# Patient Record
Sex: Female | Born: 1970
Health system: Southern US, Community
[De-identification: ages and names within clinical notes are randomized; demographics above are authoritative.]

## PROBLEM LIST (undated history)

## (undated) DIAGNOSIS — B009 Herpesviral infection, unspecified: Secondary | ICD-10-CM

## (undated) DIAGNOSIS — I2699 Other pulmonary embolism without acute cor pulmonale: Secondary | ICD-10-CM

## (undated) DIAGNOSIS — U071 COVID-19: Secondary | ICD-10-CM

## (undated) DIAGNOSIS — L659 Nonscarring hair loss, unspecified: Secondary | ICD-10-CM

## (undated) DIAGNOSIS — K219 Gastro-esophageal reflux disease without esophagitis: Secondary | ICD-10-CM

## (undated) HISTORY — DX: Gastro-esophageal reflux disease without esophagitis: K21.9

## (undated) HISTORY — DX: Herpesviral infection, unspecified: B00.9

## (undated) HISTORY — DX: Other pulmonary embolism without acute cor pulmonale: I26.99

## (undated) HISTORY — DX: Nonscarring hair loss, unspecified: L65.9

---

## 2008-05-11 ENCOUNTER — Emergency Department (HOSPITAL_COMMUNITY): Admission: EM | Admit: 2008-05-11 | Discharge: 2008-05-11 | Payer: Self-pay | Admitting: Emergency Medicine

## 2009-03-12 ENCOUNTER — Inpatient Hospital Stay (HOSPITAL_COMMUNITY): Admission: AD | Admit: 2009-03-12 | Discharge: 2009-03-13 | Payer: Self-pay | Admitting: Obstetrics and Gynecology

## 2010-07-27 LAB — CBC
HCT: 38 % (ref 36.0–46.0)
Hemoglobin: 10.7 g/dL — ABNORMAL LOW (ref 12.0–15.0)
Hemoglobin: 12.8 g/dL (ref 12.0–15.0)
MCHC: 33.7 g/dL (ref 30.0–36.0)
MCV: 92.4 fL (ref 78.0–100.0)
RBC: 3.45 MIL/uL — ABNORMAL LOW (ref 3.87–5.11)
RDW: 14.3 % (ref 11.5–15.5)
WBC: 18 10*3/uL — ABNORMAL HIGH (ref 4.0–10.5)

## 2013-05-28 ENCOUNTER — Other Ambulatory Visit: Payer: Self-pay | Admitting: Obstetrics and Gynecology

## 2013-05-28 DIAGNOSIS — R928 Other abnormal and inconclusive findings on diagnostic imaging of breast: Secondary | ICD-10-CM

## 2013-06-12 ENCOUNTER — Ambulatory Visit
Admission: RE | Admit: 2013-06-12 | Discharge: 2013-06-12 | Disposition: A | Discharge status: Home / Self Care | Payer: 59 | Source: Ambulatory Visit | Attending: Obstetrics and Gynecology | Admitting: Obstetrics and Gynecology

## 2013-06-12 ENCOUNTER — Other Ambulatory Visit: Payer: Self-pay | Admitting: Obstetrics and Gynecology

## 2013-06-12 DIAGNOSIS — R928 Other abnormal and inconclusive findings on diagnostic imaging of breast: Secondary | ICD-10-CM

## 2013-06-12 DIAGNOSIS — N6320 Unspecified lump in the left breast, unspecified quadrant: Secondary | ICD-10-CM

## 2014-06-15 ENCOUNTER — Other Ambulatory Visit: Payer: Self-pay | Admitting: Obstetrics and Gynecology

## 2014-06-15 DIAGNOSIS — N63 Unspecified lump in unspecified breast: Secondary | ICD-10-CM

## 2014-06-22 ENCOUNTER — Ambulatory Visit
Admission: RE | Admit: 2014-06-22 | Discharge: 2014-06-22 | Disposition: A | Discharge status: Home / Self Care | Payer: 59 | Source: Ambulatory Visit | Attending: Obstetrics and Gynecology | Admitting: Obstetrics and Gynecology

## 2014-06-22 DIAGNOSIS — N63 Unspecified lump in unspecified breast: Secondary | ICD-10-CM

## 2014-08-14 ENCOUNTER — Other Ambulatory Visit: Payer: Self-pay | Admitting: Family Medicine

## 2014-08-14 MED ORDER — CEPHALEXIN 500 MG PO CAPS: 500.0000 mg | ORAL_CAPSULE | Freq: Three times a day (TID) | ORAL | Status: DC

## 2015-06-16 DIAGNOSIS — Z124 Encounter for screening for malignant neoplasm of cervix: Secondary | ICD-10-CM | POA: Diagnosis not present

## 2015-06-16 DIAGNOSIS — J329 Chronic sinusitis, unspecified: Secondary | ICD-10-CM | POA: Diagnosis not present

## 2015-06-16 DIAGNOSIS — Z6827 Body mass index (BMI) 27.0-27.9, adult: Secondary | ICD-10-CM | POA: Diagnosis not present

## 2015-06-16 DIAGNOSIS — Z1389 Encounter for screening for other disorder: Secondary | ICD-10-CM | POA: Diagnosis not present

## 2015-06-16 DIAGNOSIS — Z1151 Encounter for screening for human papillomavirus (HPV): Secondary | ICD-10-CM | POA: Diagnosis not present

## 2015-06-16 DIAGNOSIS — Z01419 Encounter for gynecological examination (general) (routine) without abnormal findings: Secondary | ICD-10-CM | POA: Diagnosis not present

## 2015-06-16 DIAGNOSIS — Z130 Encounter for screening for diseases of the blood and blood-forming organs and certain disorders involving the immune mechanism: Secondary | ICD-10-CM | POA: Diagnosis not present

## 2015-06-16 MED FILL — AZITHROMYCIN 250 MG TABLET: 250 | 5 days supply | Qty: 6 | Fill #0

## 2015-06-23 DIAGNOSIS — Z1231 Encounter for screening mammogram for malignant neoplasm of breast: Secondary | ICD-10-CM | POA: Diagnosis not present

## 2016-02-11 DIAGNOSIS — Z23 Encounter for immunization: Secondary | ICD-10-CM | POA: Diagnosis not present

## 2016-04-05 MED FILL — BIMATOPROST 0.03% EYELASH S: 0.03 | 30 days supply | Qty: 5 | Fill #0

## 2016-07-07 DIAGNOSIS — L638 Other alopecia areata: Secondary | ICD-10-CM | POA: Diagnosis not present

## 2016-07-07 MED FILL — CLOBETASOL PROP 0.05% FOAM: 0.05 | 30 days supply | Qty: 100 | Fill #0

## 2016-08-18 DIAGNOSIS — Z79899 Other long term (current) drug therapy: Secondary | ICD-10-CM | POA: Diagnosis not present

## 2016-08-18 DIAGNOSIS — L638 Other alopecia areata: Secondary | ICD-10-CM | POA: Diagnosis not present

## 2016-08-18 MED FILL — FOLIC ACID 1 MG TABLET: 1 | 30 days supply | Qty: 30 | Fill #0

## 2016-09-22 DIAGNOSIS — L638 Other alopecia areata: Secondary | ICD-10-CM | POA: Diagnosis not present

## 2016-09-25 MED FILL — FOLIC ACID 1 MG TABLET: 1 | 30 days supply | Qty: 30 | Fill #1

## 2018-02-08 MED FILL — CHLORHEXIDINE 0.12% RINSE: 0.12 | 16 days supply | Qty: 473 | Fill #0

## 2018-10-25 MED FILL — CLOBETASOL PROPIONATE 0.05: 0.05 | 10 days supply | Qty: 45 | Fill #0

## 2019-02-18 ENCOUNTER — Other Ambulatory Visit: Payer: Self-pay | Admitting: Advanced Practice Midwife

## 2019-02-18 DIAGNOSIS — N3000 Acute cystitis without hematuria: Secondary | ICD-10-CM

## 2019-02-18 MED ORDER — NITROFURANTOIN MONOHYD MACRO 100 MG PO CAPS: 100.0000 mg | ORAL_CAPSULE | Freq: Two times a day (BID) | ORAL | 1 refills | Status: DC

## 2019-02-18 MED FILL — NITROFURANTOIN MONO-MCR 100: 100 | 7 days supply | Qty: 14 | Fill #0

## 2019-02-18 NOTE — Progress Notes (Signed)
+   UTI, treated based on reported symptoms  Mallie Snooks, MSN, CNM Certified Nurse Midwife, [employer] for [employer], [place] 02/18/19 12:09 PM

## 2019-06-02 ENCOUNTER — Other Ambulatory Visit: Payer: Self-pay | Admitting: Obstetrics and Gynecology

## 2019-06-02 DIAGNOSIS — Z1231 Encounter for screening mammogram for malignant neoplasm of breast: Secondary | ICD-10-CM

## 2019-07-04 ENCOUNTER — Other Ambulatory Visit: Payer: Self-pay

## 2019-07-04 ENCOUNTER — Ambulatory Visit
Admission: RE | Admit: 2019-07-04 | Discharge: 2019-07-04 | Disposition: A | Discharge status: Home / Self Care | Payer: 59 | Source: Ambulatory Visit | Attending: Obstetrics and Gynecology | Admitting: Obstetrics and Gynecology

## 2019-07-04 DIAGNOSIS — Z1231 Encounter for screening mammogram for malignant neoplasm of breast: Secondary | ICD-10-CM

## 2019-08-11 MED FILL — NITROFURANTOIN MONO-MCR 100: 100 | 7 days supply | Qty: 14 | Fill #0

## 2019-12-09 ENCOUNTER — Other Ambulatory Visit (HOSPITAL_BASED_OUTPATIENT_CLINIC_OR_DEPARTMENT_OTHER): Payer: Self-pay | Admitting: Obstetrics and Gynecology

## 2019-12-09 MED FILL — PANTOPRAZOLE SOD DR 40 MG T: 40 | 90 days supply | Qty: 90 | Fill #0

## 2020-01-13 MED FILL — FLUARIX QUADRIVALENT 0.5 ML: 0.5 | 1 days supply | Qty: 1 | Fill #0

## 2020-02-23 MED FILL — PANTOPRAZOLE SOD DR 40 MG T: 40 | 90 days supply | Qty: 90 | Fill #1

## 2020-06-01 ENCOUNTER — Other Ambulatory Visit: Payer: Self-pay | Admitting: Obstetrics and Gynecology

## 2020-06-01 DIAGNOSIS — Z1231 Encounter for screening mammogram for malignant neoplasm of breast: Secondary | ICD-10-CM

## 2020-06-28 ENCOUNTER — Other Ambulatory Visit (HOSPITAL_BASED_OUTPATIENT_CLINIC_OR_DEPARTMENT_OTHER): Payer: Self-pay | Admitting: Dermatology

## 2020-06-28 DIAGNOSIS — L638 Other alopecia areata: Secondary | ICD-10-CM | POA: Diagnosis not present

## 2020-06-28 MED FILL — CLOBETASOL PROP 0.05% FOAM: 0.05 | 30 days supply | Qty: 50 | Fill #0

## 2020-06-28 MED FILL — BIMATOPROST 0.03% EYELASH S: 0.03 | 30 days supply | Qty: 5 | Fill #0

## 2020-07-21 ENCOUNTER — Ambulatory Visit
Admission: RE | Admit: 2020-07-21 | Discharge: 2020-07-21 | Disposition: A | Discharge status: Home / Self Care | Payer: 59 | Source: Ambulatory Visit | Attending: Obstetrics and Gynecology | Admitting: Obstetrics and Gynecology

## 2020-07-21 ENCOUNTER — Other Ambulatory Visit: Payer: Self-pay

## 2020-07-21 DIAGNOSIS — Z1231 Encounter for screening mammogram for malignant neoplasm of breast: Secondary | ICD-10-CM | POA: Diagnosis not present

## 2020-07-21 DIAGNOSIS — L659 Nonscarring hair loss, unspecified: Secondary | ICD-10-CM | POA: Insufficient documentation

## 2020-10-15 ENCOUNTER — Other Ambulatory Visit (HOSPITAL_BASED_OUTPATIENT_CLINIC_OR_DEPARTMENT_OTHER): Payer: Self-pay

## 2020-10-15 MED FILL — Pantoprazole Sodium EC Tab 40 MG (Base Equiv): ORAL | 90 days supply | Qty: 90 | Fill #0 | Status: AC

## 2020-12-13 ENCOUNTER — Other Ambulatory Visit (HOSPITAL_BASED_OUTPATIENT_CLINIC_OR_DEPARTMENT_OTHER): Payer: Self-pay

## 2020-12-13 MED ORDER — LO LOESTRIN FE 1 MG-10 MCG / 10 MCG PO TABS
1.0000 | ORAL_TABLET | Freq: Every day | ORAL | 1 refills | Status: DC
  Filled 2020-12-13: qty 84, 84d supply, fill #0

## 2021-02-08 ENCOUNTER — Ambulatory Visit: Payer: 59 | Attending: Internal Medicine

## 2021-02-08 ENCOUNTER — Other Ambulatory Visit (HOSPITAL_BASED_OUTPATIENT_CLINIC_OR_DEPARTMENT_OTHER): Payer: Self-pay

## 2021-02-08 DIAGNOSIS — Z23 Encounter for immunization: Secondary | ICD-10-CM

## 2021-02-08 MED ORDER — INFLUENZA VAC SPLIT QUAD 0.5 ML IM SUSY
PREFILLED_SYRINGE | INTRAMUSCULAR | 0 refills | Status: DC
  Filled 2021-02-08: qty 0.5, 1d supply, fill #0

## 2021-02-08 NOTE — Progress Notes (Signed)
   Covid-19 Vaccination Clinic  Name:  April Herring    MRN: [id number] DOB: 1970/12/09  02/08/2021  Ms. April Herring was observed post Covid-19 immunization for 15 minutes without incident. She was provided with Vaccine Information Sheet and instruction to access the V-Safe system.   Ms. April Herring was instructed to call 911 with any severe reactions post vaccine: Difficulty breathing  Swelling of face and throat  A fast heartbeat  A bad rash all over body  Dizziness and weakness

## 2021-03-02 ENCOUNTER — Inpatient Hospital Stay (HOSPITAL_BASED_OUTPATIENT_CLINIC_OR_DEPARTMENT_OTHER)
Admission: EM | Admit: 2021-03-02 | Discharge: 2021-03-04 | DRG: 175 | Disposition: A | Discharge status: Home / Self Care | Payer: 59 | Attending: Family Medicine | Admitting: Family Medicine

## 2021-03-02 ENCOUNTER — Emergency Department (HOSPITAL_BASED_OUTPATIENT_CLINIC_OR_DEPARTMENT_OTHER): Payer: 59

## 2021-03-02 ENCOUNTER — Encounter (HOSPITAL_BASED_OUTPATIENT_CLINIC_OR_DEPARTMENT_OTHER): Payer: Self-pay | Admitting: Emergency Medicine

## 2021-03-02 DIAGNOSIS — N951 Menopausal and female climacteric states: Secondary | ICD-10-CM | POA: Diagnosis present

## 2021-03-02 DIAGNOSIS — I82452 Acute embolism and thrombosis of left peroneal vein: Secondary | ICD-10-CM | POA: Diagnosis not present

## 2021-03-02 DIAGNOSIS — N939 Abnormal uterine and vaginal bleeding, unspecified: Secondary | ICD-10-CM | POA: Diagnosis not present

## 2021-03-02 DIAGNOSIS — Z86711 Personal history of pulmonary embolism: Secondary | ICD-10-CM

## 2021-03-02 DIAGNOSIS — I82431 Acute embolism and thrombosis of right popliteal vein: Secondary | ICD-10-CM | POA: Diagnosis not present

## 2021-03-02 DIAGNOSIS — R778 Other specified abnormalities of plasma proteins: Secondary | ICD-10-CM | POA: Diagnosis present

## 2021-03-02 DIAGNOSIS — I2694 Multiple subsegmental pulmonary emboli without acute cor pulmonale: Secondary | ICD-10-CM

## 2021-03-02 DIAGNOSIS — R000 Tachycardia, unspecified: Secondary | ICD-10-CM | POA: Diagnosis not present

## 2021-03-02 DIAGNOSIS — J90 Pleural effusion, not elsewhere classified: Secondary | ICD-10-CM | POA: Diagnosis not present

## 2021-03-02 DIAGNOSIS — Z8616 Personal history of COVID-19: Secondary | ICD-10-CM | POA: Diagnosis not present

## 2021-03-02 DIAGNOSIS — R509 Fever, unspecified: Secondary | ICD-10-CM | POA: Diagnosis not present

## 2021-03-02 DIAGNOSIS — R0602 Shortness of breath: Secondary | ICD-10-CM | POA: Diagnosis not present

## 2021-03-02 DIAGNOSIS — I2699 Other pulmonary embolism without acute cor pulmonale: Secondary | ICD-10-CM | POA: Diagnosis not present

## 2021-03-02 DIAGNOSIS — E876 Hypokalemia: Secondary | ICD-10-CM | POA: Diagnosis present

## 2021-03-02 DIAGNOSIS — Z20822 Contact with and (suspected) exposure to covid-19: Secondary | ICD-10-CM | POA: Diagnosis present

## 2021-03-02 DIAGNOSIS — Z79899 Other long term (current) drug therapy: Secondary | ICD-10-CM

## 2021-03-02 DIAGNOSIS — I2609 Other pulmonary embolism with acute cor pulmonale: Principal | ICD-10-CM | POA: Diagnosis present

## 2021-03-02 DIAGNOSIS — I2721 Secondary pulmonary arterial hypertension: Secondary | ICD-10-CM | POA: Diagnosis present

## 2021-03-02 DIAGNOSIS — K219 Gastro-esophageal reflux disease without esophagitis: Secondary | ICD-10-CM | POA: Diagnosis present

## 2021-03-02 DIAGNOSIS — R059 Cough, unspecified: Secondary | ICD-10-CM | POA: Diagnosis not present

## 2021-03-02 DIAGNOSIS — R7989 Other specified abnormal findings of blood chemistry: Secondary | ICD-10-CM | POA: Diagnosis not present

## 2021-03-02 HISTORY — DX: COVID-19: U07.1

## 2021-03-02 LAB — CBC
HCT: 39.9 % (ref 36.0–46.0)
Hemoglobin: 13.2 g/dL (ref 12.0–15.0)
MCH: 30.9 pg (ref 26.0–34.0)
MCHC: 33.1 g/dL (ref 30.0–36.0)
MCV: 93.4 fL (ref 80.0–100.0)
Platelets: 242 10*3/uL (ref 150–400)
RBC: 4.27 MIL/uL (ref 3.87–5.11)
RDW: 12.1 % (ref 11.5–15.5)
WBC: 7.3 10*3/uL (ref 4.0–10.5)
nRBC: 0 % (ref 0.0–0.2)

## 2021-03-02 LAB — BASIC METABOLIC PANEL
Anion gap: 9 (ref 5–15)
BUN: 10 mg/dL (ref 6–20)
CO2: 22 mmol/L (ref 22–32)
Calcium: 8.5 mg/dL — ABNORMAL LOW (ref 8.9–10.3)
Chloride: 105 mmol/L (ref 98–111)
Creatinine, Ser: 0.72 mg/dL (ref 0.44–1.00)
GFR, Estimated: >60 mL/min (ref >60)
Glucose, Bld: 138 mg/dL — ABNORMAL HIGH (ref 70–99)
Potassium: 3.2 mmol/L — ABNORMAL LOW (ref 3.5–5.1)
Sodium: 136 mmol/L (ref 135–145)

## 2021-03-02 LAB — APTT: aPTT: 27 seconds (ref 24–36)

## 2021-03-02 LAB — D-DIMER, QUANTITATIVE: D-Dimer, Quant: 9.57 ug/mL-FEU — ABNORMAL HIGH (ref 0.00–0.50)

## 2021-03-02 LAB — RESP PANEL BY RT-PCR (FLU A&B, COVID) ARPGX2
Influenza A by PCR: NEGATIVE
Influenza B by PCR: NEGATIVE
SARS Coronavirus 2 by RT PCR: NEGATIVE

## 2021-03-02 LAB — HEPARIN LEVEL (UNFRACTIONATED)
Heparin Unfractionated: 0.95 IU/mL — ABNORMAL HIGH (ref 0.30–0.70)
Heparin Unfractionated: 0.15 IU/mL — ABNORMAL LOW (ref 0.30–0.70)

## 2021-03-02 LAB — BRAIN NATRIURETIC PEPTIDE: B Natriuretic Peptide: 41.4 pg/mL (ref 0.0–100.0)

## 2021-03-02 LAB — TROPONIN I (HIGH SENSITIVITY)
Troponin I (High Sensitivity): 32 ng/L — ABNORMAL HIGH (ref <18)
Troponin I (High Sensitivity): 29 ng/L — ABNORMAL HIGH (ref <18)

## 2021-03-02 LAB — PROTIME-INR
INR: 1 (ref 0.8–1.2)
Prothrombin Time: 13.4 seconds (ref 11.4–15.2)

## 2021-03-02 MED ORDER — ACETAMINOPHEN 325 MG PO TABS
650.0000 mg | ORAL_TABLET | Freq: Once | ORAL | Status: AC
  Administered 2021-03-02: 650 mg via ORAL
  Filled 2021-03-02: qty 2

## 2021-03-02 MED ORDER — PANTOPRAZOLE SODIUM 40 MG PO TBEC
40.0000 mg | DELAYED_RELEASE_TABLET | Freq: Two times a day (BID) | ORAL | Status: DC
  Administered 2021-03-02 – 2021-03-04 (×4): 40 mg via ORAL
  Filled 2021-03-02 (×4): qty 1

## 2021-03-02 MED ORDER — HEPARIN BOLUS VIA INFUSION
2400.0000 [IU] | Freq: Once | INTRAVENOUS | Status: AC
  Administered 2021-03-02: 2400 [IU] via INTRAVENOUS
  Filled 2021-03-02: qty 2400

## 2021-03-02 MED ORDER — IBUPROFEN 200 MG PO TABS
400.0000 mg | ORAL_TABLET | Freq: Three times a day (TID) | ORAL | Status: DC
  Administered 2021-03-02: 400 mg via ORAL
  Filled 2021-03-02 (×3): qty 2

## 2021-03-02 MED ORDER — IOHEXOL 350 MG/ML SOLN
100.0000 mL | Freq: Once | INTRAVENOUS | Status: AC | PRN
  Administered 2021-03-02: 100 mL via INTRAVENOUS

## 2021-03-02 MED ORDER — SODIUM CHLORIDE 0.45 % IV SOLN: INTRAVENOUS | Status: DC

## 2021-03-02 MED ORDER — HEPARIN (PORCINE) 25000 UT/250ML-% IV SOLN
1900.0000 [IU]/h | INTRAVENOUS | Status: DC
  Administered 2021-03-02: 1350 [IU]/h via INTRAVENOUS
  Administered 2021-03-03: 1900 [IU]/h via INTRAVENOUS
  Administered 2021-03-03: 1600 [IU]/h via INTRAVENOUS
  Administered 2021-03-04: 1900 [IU]/h via INTRAVENOUS
  Filled 2021-03-02 (×4): qty 250

## 2021-03-02 MED ORDER — HEPARIN BOLUS VIA INFUSION
6300.0000 [IU] | Freq: Once | INTRAVENOUS | Status: AC
  Administered 2021-03-02: 6300 [IU] via INTRAVENOUS

## 2021-03-02 NOTE — H&P (Signed)
History and Physical    April Herring OAC:[id number] DOB: [birth year] DOA: 03/02/2021  PCP: Huel Cote, MD   Patient coming from: home  I have personally briefly reviewed patient's old medical records in [facility] Link  Chief Complaint: left lower chest pain and SOB  HPI: April Herring is a 50 y.o. female with medical history significant of Covid 4 infectin in January '22, otherwise healthy. She reports mild interrmittent cough and low energy since January. For the past several days she felt a heaviness in her chest and had respiratory related sharp pain left low chest, posteriorly. For these symptoms she presented to [facility] for evaluation.    ED Course: T 98.9  BP 139/86 to 150/104, RR 25  HR 95. Cmet nl, CBCD nl, INR 1.0, D-dimer 9.57. CTA with submassive PE with right heart strain based on LV/RV ratio, small infarct left lower lobe. Patient was started in on IV heparin. Pul consult recommended [facility] admit and 2D echo. [facility] accepted admission.   Review of Systems: As per HPI otherwise 10 point review of systems negative.    Past Medical History:  Diagnosis Date   COVID-19     Past Surgical History:  Procedure Laterality Date   BREAST BIOPSY Left 06/13/2013   benign    Soc Hx - married with three children. Works as L&D Engineer, civil (consulting) at [employer].   reports that she has never smoked. She has never used smokeless tobacco. She reports current alcohol use. She reports that she does not use drugs.  Not on File  Family History  Problem Relation Age of Onset   Breast cancer Maternal Aunt    Breast cancer Maternal Grandmother    Unacceptable: Noncontributory, unremarkable, or negative. Acceptable: Family history reviewed and not pertinent (If you reviewed it)  Prior to Admission medications   Medication Sig Start Date End Date Taking? Authorizing Provider  bimatoprost (LATISSE) 0.03 % ophthalmic solution APPLY 1 DROP DAILY TO AFFECTED AREA 06/28/20 06/28/21  Bufford Buttner, MD   cephALEXin (KEFLEX) 500 MG capsule Take 1 capsule (500 mg total) by mouth 3 (three) times daily. 08/14/14   Levie Heritage, DO  clobetasol (OLUX) 0.05 % topical foam APPLY TO THE AFFECTED AREA(S) TWICE A DAY AS NEEDED 06/28/20 06/28/21  Bufford Buttner, MD  influenza vac split quadrivalent PF (FLUARIX) 0.5 ML injection Inject into the muscle. 02/08/21   Judyann Munson, MD  LO LOESTRIN FE 1 MG-10 MCG / 10 MCG tablet TAKE 1 TABLET BY MOUTH ONCE DAILY 12/12/20     nitrofurantoin, macrocrystal-monohydrate, (MACROBID) 100 MG capsule Take 1 capsule (100 mg total) by mouth 2 (two) times daily. 02/18/19   Clayton Bibles C, CNM  pantoprazole (PROTONIX) 40 MG tablet TAKE 1 TABLET BY MOUTH ONCE DAILY. 12/09/19 01/13/21  Sherian Rein, MD    Physical Exam: Vitals:   03/02/21 1530 03/02/21 1600 03/02/21 1600 03/02/21 1718  BP: (!) 155/103 (!) 165/104  (!) 150/104  Pulse: 96 (!) 106  95  Resp:      Temp:   98.4 F (36.9 C) 98.9 F (37.2 C)  TempSrc:   Oral Oral  SpO2: 96% 98%  100%  Weight:      Height:         Vitals:   03/02/21 1530 03/02/21 1600 03/02/21 1600 03/02/21 1718  BP: (!) 155/103 (!) 165/104  (!) 150/104  Pulse: 96 (!) 106  95  Resp:      Temp:   98.4 F (36.9 C) 98.9 F (  37.2 C)  TempSrc:   Oral Oral  SpO2: 96% 98%  100%  Weight:      Height:       General: WNWD woman in no distress Eyes: PERRL, lids and conjunctivae normal ENMT: Mucous membranes are moist. Posterior pharynx clear of any exudate or lesions.Normal dentition.  Neck: normal, supple, no masses, no thyromegaly Respiratory: clear to auscultation bilaterally, no wheezing, no crackles. Normal respiratory effort. No accessory muscle use.  Cardiovascular: Regular rate and rhythm, no murmurs / rubs / gallops. No extremity edema. 2+ pedal pulses. No carotid bruits.  Abdomen: no tenderness, no masses palpated. No hepatosplenomegaly. Bowel sounds positive.  Musculoskeletal: no clubbing / cyanosis. No joint  deformity upper and lower extremities. Good ROM, no contractures. Normal muscle tone. No tenderness calves.   Skin: no rashes, lesions, ulcers. No induration Neurologic: CN 2-12 grossly intact. Strength 5/5 in all 4.  Psychiatric: Normal judgment and insight. Alert and oriented x 3. Normal mood.   (  Labs on Admission: I have personally reviewed following labs and imaging studies  CBC: Recent Labs  Lab 03/02/21 1020  WBC 7.3  HGB 13.2  HCT 39.9  MCV 93.4  PLT 242   Basic Metabolic Panel: Recent Labs  Lab 03/02/21 1020  NA 136  K 3.2*  CL 105  CO2 22  GLUCOSE 138*  BUN 10  CREATININE 0.72  CALCIUM 8.5*   GFR: Estimated Creatinine Clearance: 93.8 mL/min (by C-G formula based on SCr of 0.72 mg/dL). Liver Function Tests: No results for input(s): AST, ALT, ALKPHOS, BILITOT, PROT, ALBUMIN in the last 168 hours. No results for input(s): LIPASE, AMYLASE in the last 168 hours. No results for input(s): AMMONIA in the last 168 hours. Coagulation Profile: Recent Labs  Lab 03/02/21 1156  INR 1.0   Cardiac Enzymes: No results for input(s): CKTOTAL, CKMB, CKMBINDEX, TROPONINI in the last 168 hours. BNP (last 3 results) No results for input(s): PROBNP in the last 8760 hours. HbA1C: No results for input(s): HGBA1C in the last 72 hours. CBG: No results for input(s): GLUCAP in the last 168 hours. Lipid Profile: No results for input(s): CHOL, HDL, LDLCALC, TRIG, CHOLHDL, LDLDIRECT in the last 72 hours. Thyroid Function Tests: No results for input(s): TSH, T4TOTAL, FREET4, T3FREE, THYROIDAB in the last 72 hours. Anemia Panel: No results for input(s): VITAMINB12, FOLATE, FERRITIN, TIBC, IRON, RETICCTPCT in the last 72 hours. Urine analysis: No results found for: COLORURINE, APPEARANCEUR, LABSPEC, PHURINE, GLUCOSEU, HGBUR, BILIRUBINUR, KETONESUR, PROTEINUR, UROBILINOGEN, NITRITE, LEUKOCYTESUR  Radiological Exams on Admission: CT Angio Chest PE W/Cm &/Or Wo Cm  Result Date:  03/02/2021 CLINICAL DATA:  Positive D-dimer, cough and shortness of breath. Chest heaviness, fever. EXAM: CT ANGIOGRAPHY CHEST WITH CONTRAST TECHNIQUE: Multidetector CT imaging of the chest was performed using the standard protocol during bolus administration of intravenous contrast. Multiplanar CT image reconstructions and MIPs were obtained to evaluate the vascular anatomy. CONTRAST:  OMNIPAQUE IOHEXOL 350 MG/ML SOLN COMPARISON:  None. FINDINGS: Cardiovascular: There are filling defects in the pulmonary arteries bilaterally, with the most proximal clot seen at the lobar level bilaterally. RV/LV ratio 0.9. Pulmonic trunk and heart are enlarged. No pericardial effusion. Mediastinum/Nodes: Subcentimeter low-attenuation lesion in the right thyroid. No follow-up recommended. (Ref: J Am Coll Radiol. 2015 Feb;12(2): 143-50).No pathologically enlarged mediastinal, hilar or axillary lymph nodes. Esophagus is unremarkable. Lungs/Pleura: Small area of patchy ground-glass and consolidation in the inferior aspect of the posterior left lower lobe. Lungs are otherwise clear. Tiny left pleural effusion.  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 Airway is unremarkable. Upper Abdomen: Visualized portions of the liver, gallbladder, adrenal glands, left kidney, spleen and pancreas are unremarkable. 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 No upper abdominal adenopathy. Musculoskeletal: Mild degenerative changes in the spine. No worrisome lytic or sclerotic lesions. Review of the MIP images confirms the above findings. IMPRESSION: 1. Positive for acute PE with CT evidence of right heart strain (RV/LV Ratio = 0.9) consistent with at least submassive (intermediate risk) PE. The presence of right heart strain has been associated with an increased risk of morbidity and mortality. Please refer to the "PE Focused" order set in EPIC.  Critical Value/emergent results were called by telephone at the time of interpretation on 03/02/2021 at 1:43 pm to provider DR. DYKSTRA, Who verbally acknowledged these results. 2. Probable small area of infarction in the posterior left lower lobe. 3. Tiny left pleural effusion. 4. Enlarged pulmonic trunk, indicative of pulmonary arterial hypertension. Electronically Signed   By: Leanna Battles M.D.   On: 03/02/2021 13:45   DG Chest Port 1 View  Result Date: 03/02/2021 CLINICAL DATA:  Shortness of breath EXAM: PORTABLE CHEST 1 VIEW COMPARISON:  None. FINDINGS: The heart size and mediastinal contours are within normal limits. Both lungs are clear. The visualized skeletal structures are unremarkable. IMPRESSION: No active disease. Electronically Signed   By: Ernie Avena M.D.   On: 03/02/2021 10:23    EKG: Independently reviewed. Sins tachycardia, no acute changes  Assessment/Plan Active Problems:   PE (pulmonary thromboembolism) (HCC)  PE - patient with elevated D-Dimer, postive CTA for PE with right heart strain and pulmonary infact left lower lobe. Takes a low dose estrogen medication. No family h/o coagulopathy. No coagulation problems with three pregnancies. No abnormal bleeding. No long periods of sitting. No chronic medical problems. Plan IV heparin, at least 24 hours then transition to NOAC (Xarelto) for at least 3 months.  LE venous dopplers.  2D echo  F/u PCP  DVT prophylaxis: full dose heparin  Code Status: full code Family Communication: husband present during interview and exam. Answered all questions  Disposition Plan: home when medically stable   Consults called: none Admission status: inpatient    Illene Regulus MD [facility] Pager [phone]  If 7PM-7AM, please contact night-coverage [email or web address] Password [facility]  03/02/2021, 6:26 PM

## 2021-03-02 NOTE — Progress Notes (Signed)
Received a phone call from Facility: [facility]  Requesting MD: Toney Sang Patient without significant medical history presenting with cough, fever, chest discomfort.  Presented with CP/SOB.  Has submassive PE, no saddle PE.  Spoke with pulm and they recommend admission at [facility].  Hemodynamically stable.  Started on heparin.  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   Plan of care: Admit, continue Heparin, obtain stat echo upon arrival looking for R heart strain. The patient will be accepted for admission to progressive care at [facility] when bed is available.    Nursing staff, Please call the [facility] Admits & Consults System-Wide number at the top of Amion at the time of the patient's arrival so that the patient can be paged to the admitting physician.   Georgana Curio, M.D. [facility]

## 2021-03-02 NOTE — Progress Notes (Signed)
ANTICOAGULATION CONSULT NOTE - Follow Up Consult  Pharmacy Consult for IV Heparin Indication: pulmonary embolus  No Known Allergies  Patient Measurements: Height: 5\' 7"  (170.2 cm) Weight: 86.2 kg (190 lb) IBW/kg (Calculated) : 61.6 Heparin Dosing Weight: 79.8 kg  Vital Signs: Temp: 98.9 F (37.2 C) (11/09 1718) Temp Source: Oral (11/09 1718) BP: 150/104 (11/09 1718) Pulse Rate: 95 (11/09 1718)  Labs: Recent Labs    03/02/21 1020 03/02/21 1149 03/02/21 1156 03/02/21 1237 03/02/21 1520 03/02/21 1938  HGB 13.2  --   --   --   --   --   HCT 39.9  --   --   --   --   --   PLT 242  --   --   --   --   --   APTT  --  27  --   --   --   --   LABPROT  --   --  13.4  --   --   --   INR  --   --  1.0  --   --   --   HEPARINUNFRC  --   --   --   --  0.95* 0.15*  CREATININE 0.72  --   --   --   --   --   TROPONINIHS 32*  --   --  29*  --   --     Estimated Creatinine Clearance: 93.8 mL/min (by C-G formula based on SCr of 0.72 mg/dL).  Medical History: Past Medical History:  Diagnosis Date   COVID-19     Assessment 50 yr old female admitted today with acute PE with RHS; d-dimer 9.57. Pt had COVID in January 2022 and takes low-dose estrogen. Pharmacy was consulted to dose IV  heparin; pt was on no anticoagulants PTA.   Heparin level ~5.5 hrs after heparin 6300 units IV bolus X 1, followed by heparin infusion at 1350 units/hr, was 0.15 units/ml, which is below the goal range for this pt. H/H 13.2/39.9, plt 242. Per RN, no issues with IV or bleeding observed.  Goal of Therapy:  Heparin level 0.3-0.7 units/ml Monitor platelets by anticoagulation protocol: Yes   Plan:  Heparin 2400 units IV bolus X 1 Increase heparin infusion to 1600 units/hr Check heparin level in 6 hrs Monitor daily heparin level, CBC Monitor for bleeding F/U transition to oral anticoagulant   12-31-1970, PharmD, BCPS, [facility] Clinical Pharmacist

## 2021-03-02 NOTE — ED Triage Notes (Signed)
Pt reports cough x 2d; sts chest feels heavy; reports 103 fever last night

## 2021-03-02 NOTE — ED Notes (Signed)
[facility] 

## 2021-03-02 NOTE — ED Provider Notes (Signed)
[facility] EMERGENCY DEPARTMENT Provider Note   CSN: [id number] Arrival date & time: 03/02/21  2952     History Chief Complaint  Patient presents with   Chest Pain   Fever   Cough    April Herring is a 50 y.o. female.   Chest Pain Associated symptoms: cough and fever   Associated symptoms: no back pain, no headache, no nausea, no shortness of breath and no vomiting   Fever Associated symptoms: chest pain and cough   Associated symptoms: no headaches, no nausea and no vomiting   Cough Associated symptoms: chest pain and fever   Associated symptoms: no headaches and no shortness of breath     Patient presents with cough, fever, chest pressure.  Reports she had the flu and COVID-vaccine 3 weeks ago, she has not felt right since then.  About 2 days ago she started having increased frequency of a cough.  It is nonproductive, no hemoptysis.  She is been having fevers at home of 101 orally, has taken Tylenol which has improved the symptoms.  Reports that the chest pressure started last night and has been constant.  It feels like a heaviness on her chest, does not radiate elsewhere there is no associated nausea or vomiting or shortness of breath.  It is not sharp.  Past Medical History:  Diagnosis Date   COVID-19     There are no problems to display for this patient.   Past Surgical History:  Procedure Laterality Date   BREAST BIOPSY Left 06/13/2013   benign     OB History   No obstetric history on file.     Family History  Problem Relation Age of Onset   Breast cancer Maternal Aunt    Breast cancer Maternal Grandmother     Social History   Tobacco Use   Smoking status: Never   Smokeless tobacco: Never  Substance Use Topics   Alcohol use: Yes    Comment: social   Drug use: Never    Home Medications Prior to Admission medications   Medication Sig Start Date End Date Taking? Authorizing Provider  bimatoprost (LATISSE) 0.03 % ophthalmic solution  APPLY 1 DROP DAILY TO AFFECTED AREA 06/28/20 06/28/21  Bufford Buttner, MD  cephALEXin (KEFLEX) 500 MG capsule Take 1 capsule (500 mg total) by mouth 3 (three) times daily. 08/14/14   Levie Heritage, DO  clobetasol (OLUX) 0.05 % topical foam APPLY TO THE AFFECTED AREA(S) TWICE A DAY AS NEEDED 06/28/20 06/28/21  Bufford Buttner, MD  influenza vac split quadrivalent PF (FLUARIX) 0.5 ML injection Inject into the muscle. 02/08/21   Judyann Munson, MD  LO LOESTRIN FE 1 MG-10 MCG / 10 MCG tablet TAKE 1 TABLET BY MOUTH ONCE DAILY 12/12/20     nitrofurantoin, macrocrystal-monohydrate, (MACROBID) 100 MG capsule Take 1 capsule (100 mg total) by mouth 2 (two) times daily. 02/18/19   Clayton Bibles C, CNM  pantoprazole (PROTONIX) 40 MG tablet TAKE 1 TABLET BY MOUTH ONCE DAILY. 12/09/19 01/13/21  Sherian Rein, MD    Allergies    Patient has no allergy information on record.  Review of Systems   Review of Systems  Constitutional:  Positive for fever.  Eyes:  Negative for photophobia.  Respiratory:  Positive for cough. Negative for shortness of breath.   Cardiovascular:  Positive for chest pain. Negative for leg swelling.  Gastrointestinal:  Negative for nausea and vomiting.  Musculoskeletal:  Negative for back pain.  Neurological:  Negative for light-headedness and  headaches.   Physical Exam Updated Vital Signs Temp 97.9 F (36.6 C) (Oral)   Ht 5\' 7"  (1.702 m)   Wt 86.2 kg   BMI 29.76 kg/m   Physical Exam Vitals and nursing note reviewed. Exam conducted with a chaperone present.  Constitutional:      Appearance: Normal appearance.  HENT:     Head: Normocephalic and atraumatic.  Eyes:     General: No scleral icterus.       Right eye: No discharge.        Left eye: No discharge.     Extraocular Movements: Extraocular movements intact.     Pupils: Pupils are equal, round, and reactive to light.  Cardiovascular:     Rate and Rhythm: Regular rhythm. Tachycardia present.      Pulses: Normal pulses.     Heart sounds: Normal heart sounds. No murmur heard.   No friction rub. No gallop.     Comments: Patient is tachycardic at 110.  Rate is regular, radial pulses equal bilaterally.  No murmurs or rubs auscultated. Pulmonary:     Effort: Pulmonary effort is normal. No respiratory distress.     Breath sounds: Normal breath sounds.     Comments: Lungs are clear to auscultation bilaterally, no tachypnea or accessory muscle use. Abdominal:     General: Abdomen is flat. Bowel sounds are normal. There is no distension.     Palpations: Abdomen is soft.     Tenderness: There is no abdominal tenderness.  Musculoskeletal:        General: No swelling.     Comments: Legs are roughly symmetric, no erythema or edema  Skin:    General: Skin is warm and dry.     Coloration: Skin is not jaundiced.  Neurological:     Mental Status: She is alert. Mental status is at baseline.     Coordination: Coordination normal.   ED Results / Procedures / Treatments   Labs (all labs ordered are listed, but only abnormal results are displayed) Labs Reviewed  RESP PANEL BY RT-PCR (FLU A&B, COVID) [id number]  BASIC METABOLIC PANEL  CBC  TROPONIN I (HIGH SENSITIVITY)    EKG None  Radiology No results found.  Procedures Procedures   Medications Ordered in ED Medications  acetaminophen (TYLENOL) tablet 650 mg (has no administration in time range)    ED Course  I have reviewed the triage vital signs and the nursing notes.  Pertinent labs & imaging results that were available during my care of the patient were reviewed by me and considered in my medical decision making (see chart for details).    MDM Rules/Calculators/A&P                           Patient mildly hypertensive, otherwise vitals are stable.  She was initially mildly tachycardic, this is improved with observation here in the ED.  Her initial troponin was 32, will need a second troponin.  EKG shows sinus tachycardia, no  ST elevation or depression changes.  BNP negative, changed x-ray negative for any cardiomegaly or pneumonia.  Do not suspect heart failure given no cardiomegaly and negative BNP.  No gross electrolyte derangement, no leukocytosis.  I will add a dimer based on the elevated troponin and initial tachycardia on intake.  Overall, do not have an overly high suspicion for PE but is definitely on the differential.  Patient has submassive PE in both pulmonary arteries, not a saddle embolism.  She is not on oxygen, not hypoxic.  Her vitals are stable, her heart rate is still elevated at 95.  We will consult pulmonology to see if she is suitable for [facility] versus [facility] admission.  We will also start patient on heparin.  Pulmonary team advises admitting to [facility] progressive care unit.  I appreciate their consult.  Spoke with Dr. Ophelia Charter at [facility].  She will admit the patient.  Appreciate her consult.    Final Clinical Impression(s) / ED Diagnoses Final diagnoses:  None    Rx / DC Orders ED Discharge Orders     None        Theron Arista, [place] 03/02/21 1413    Milagros Loll, MD 03/03/21 [phone]

## 2021-03-02 NOTE — ED Notes (Signed)
Pt transported to CT ?

## 2021-03-02 NOTE — Progress Notes (Signed)
ANTICOAGULATION CONSULT NOTE - Initial Consult  Pharmacy Consult for heparin Indication: pulmonary embolus  Not on File  Patient Measurements: Height: 5\' 7"  (170.2 cm) Weight: 86.2 kg (190 lb) IBW/kg (Calculated) : 61.6 Heparin Dosing Weight: 79.8 kg  Vital Signs: Temp: 97.9 F (36.6 C) (11/09 1000) Temp Source: Oral (11/09 1000) BP: 144/96 (11/09 1300) Pulse Rate: 87 (11/09 1300)  Labs: Recent Labs    03/02/21 1020 03/02/21 1156 03/02/21 1237  HGB 13.2  --   --   HCT 39.9  --   --   PLT 242  --   --   LABPROT  --  13.4  --   INR  --  1.0  --   CREATININE 0.72  --   --   TROPONINIHS 32*  --  29*    Estimated Creatinine Clearance: 93.8 mL/min (by C-G formula based on SCr of 0.72 mg/dL).   Medical History: Past Medical History:  Diagnosis Date   COVID-19     Medications: see MAR  Assessment: 50 yo F with acute PE with RHS. RV/LV ratio = 0.9. CBC ok, no AC PTA.   Goal of Therapy:  Heparin level 0.3-0.7 units/ml Monitor platelets by anticoagulation protocol: Yes   Plan:  Give 6300 units bolus x 1 Start heparin infusion at 1350 units/hr Check anti-Xa level in 6 hours and daily while on heparin Continue to monitor H&H and platelets F/u oral AC plan on discharge  44, PharmD, [facility] Emergency Medicine Clinical Pharmacist ED RPh Phone: [phone] Main RX: [phone]

## 2021-03-02 NOTE — Plan of Care (Signed)
  Problem: Education: Goal: Knowledge of General Education information will improve Description: Including pain rating scale, medication(s)/side effects and non-pharmacologic comfort measures Outcome: Progressing   Problem: Health Behavior/Discharge Planning: Goal: Ability to manage health-related needs will improve Outcome: Progressing   Problem: Clinical Measurements: Goal: Respiratory complications will improve Outcome: Progressing   Problem: Coping: Goal: Level of anxiety will decrease Outcome: Progressing   Problem: Activity: Goal: Risk for activity intolerance will decrease Outcome: Progressing   Problem: Elimination: Goal: Will not experience complications related to bowel motility Outcome: Progressing

## 2021-03-03 ENCOUNTER — Other Ambulatory Visit (HOSPITAL_BASED_OUTPATIENT_CLINIC_OR_DEPARTMENT_OTHER): Payer: Self-pay

## 2021-03-03 ENCOUNTER — Other Ambulatory Visit: Payer: Self-pay

## 2021-03-03 ENCOUNTER — Inpatient Hospital Stay (HOSPITAL_COMMUNITY): Payer: 59

## 2021-03-03 ENCOUNTER — Other Ambulatory Visit (HOSPITAL_COMMUNITY): Payer: Self-pay

## 2021-03-03 DIAGNOSIS — I2609 Other pulmonary embolism with acute cor pulmonale: Secondary | ICD-10-CM

## 2021-03-03 DIAGNOSIS — I2699 Other pulmonary embolism without acute cor pulmonale: Secondary | ICD-10-CM

## 2021-03-03 LAB — CBC
HCT: 38.1 % (ref 36.0–46.0)
Hemoglobin: 12.7 g/dL (ref 12.0–15.0)
MCH: 31.1 pg (ref 26.0–34.0)
MCHC: 33.3 g/dL (ref 30.0–36.0)
MCV: 93.2 fL (ref 80.0–100.0)
Platelets: 237 10*3/uL (ref 150–400)
RBC: 4.09 MIL/uL (ref 3.87–5.11)
RDW: 11.9 % (ref 11.5–15.5)
WBC: 5.6 10*3/uL (ref 4.0–10.5)
nRBC: 0 % (ref 0.0–0.2)

## 2021-03-03 LAB — ECHOCARDIOGRAM COMPLETE
AR max vel: 2.83 cm2
AV Peak grad: 8 mmHg
Ao pk vel: 1.41 m/s
Area-P 1/2: 4.71 cm2
Calc EF: 57.2 %
Height: 67 in
S' Lateral: 3.4 cm
Single Plane A2C EF: 57.1 %
Single Plane A4C EF: 58 %
Weight: 3040 oz

## 2021-03-03 LAB — HEPARIN LEVEL (UNFRACTIONATED)
Heparin Unfractionated: 0.21 IU/mL — ABNORMAL LOW (ref 0.30–0.70)
Heparin Unfractionated: 0.5 IU/mL (ref 0.30–0.70)

## 2021-03-03 LAB — HIV ANTIBODY (ROUTINE TESTING W REFLEX): HIV Screen 4th Generation wRfx: NONREACTIVE

## 2021-03-03 MED ORDER — HYDROCODONE-ACETAMINOPHEN 5-325 MG PO TABS
1.0000 | ORAL_TABLET | Freq: Four times a day (QID) | ORAL | Status: DC | PRN
  Administered 2021-03-03 – 2021-03-04 (×3): 1 via ORAL
  Filled 2021-03-03 (×3): qty 1

## 2021-03-03 MED ORDER — HEPARIN BOLUS VIA INFUSION
3000.0000 [IU] | Freq: Once | INTRAVENOUS | Status: AC
  Administered 2021-03-03: 3000 [IU] via INTRAVENOUS
  Filled 2021-03-03: qty 3000

## 2021-03-03 MED ORDER — ACETAMINOPHEN 325 MG PO TABS: 650.0000 mg | ORAL_TABLET | ORAL | Status: DC | PRN

## 2021-03-03 MED ORDER — HYDROCODONE-ACETAMINOPHEN 5-325 MG PO TABS
1.0000 | ORAL_TABLET | Freq: Four times a day (QID) | ORAL | Status: DC | PRN
  Administered 2021-03-03: 1 via ORAL
  Filled 2021-03-03: qty 1

## 2021-03-03 NOTE — Progress Notes (Signed)
ANTICOAGULATION CONSULT NOTE - Follow Up Consult  Pharmacy Consult for IV Heparin Indication: pulmonary embolus  No Known Allergies  Patient Measurements: Height: 5\' 7"  (170.2 cm) Weight: 86.2 kg (190 lb) IBW/kg (Calculated) : 61.6 Heparin Dosing Weight: 79.8 kg  Vital Signs:    Labs: Recent Labs    03/02/21 1020 03/02/21 1149 03/02/21 1156 03/02/21 1237 03/02/21 1520 03/02/21 1938 03/03/21 0246 03/03/21 0957  HGB 13.2  --   --   --   --   --  12.7  --   HCT 39.9  --   --   --   --   --  38.1  --   PLT 242  --   --   --   --   --  237  --   APTT  --  27  --   --   --   --   --   --   LABPROT  --   --  13.4  --   --   --   --   --   INR  --   --  1.0  --   --   --   --   --   HEPARINUNFRC  --   --   --   --    < > 0.15* 0.21* 0.50  CREATININE 0.72  --   --   --   --   --   --   --   TROPONINIHS 32*  --   --  29*  --   --   --   --    < > = values in this interval not displayed.    Estimated Creatinine Clearance: 93.8 mL/min (by C-G formula based on SCr of 0.72 mg/dL).  Medical History: Past Medical History:  Diagnosis Date   COVID-19     Assessment 50 yr old female admitted today with acute PE with RHS; d-dimer 9.57. Pt had COVID in January 2022 and takes low-dose estrogen. Pharmacy was consulted to dose IV  heparin; pt was on no anticoagulants PTA.   Heparin level therapeutic at 0.5, likely change to DOAC soon.  Goal of Therapy:  Heparin level 0.3-0.7 units/ml Monitor platelets by anticoagulation protocol: Yes   Plan:  Continue heparin 1900 units/h Daily heparin level and CBC  February 2022, PharmD, [place], [facility] Clinical Pharmacist [phone] Please check AMION for all [facility] Pharmacy numbers 03/03/2021

## 2021-03-03 NOTE — Progress Notes (Signed)
PROGRESS NOTE  April Herring  MVH:[id number] DOB: [birth year] DOA: 03/02/2021 PCP: Huel Cote, MD (OB/GYN) Brief Narrative: April Herring is a 50 y.o. female with a history of perimenopausal symptoms recently started on loestrin and covid-19 infection Jan 2022 who presented to [facility] 11/9 with chest heaviness, cough, fever found to be afebrile, hypertensive, tachycardic, tachypneic without hypoxia, with elevated d-dimer and subsequent CTA chest confirming bilateral submassive PE's with posterior LLL infarct and RV:LV = 0.9 consistent with RV strain. PCCM was consulted, recommended hospitalist admission at [facility]. IV heparin started, echo pending.   Assessment & Plan: Provoked bilateral acute DVTs and bilateral submassive PE with RLL pulmonary infarct: Fortunately hemodynamically stable at this time with no hypoxia due to robust reserve. Troponin 32 > 29 consistent with mild strain, though RV:LV 0.9 and pulmonic trunk is enlarged.  - Continue heparin gtt through tomorrow, transition to DOAC if stable thereafter. Increase heparin dosing per pharmacy, slightly subtherapeutic overnight. CM running benefit check for xarelto vs. eliquis.  - Echo pending to evaluate RV. Clot burden is fairly significant. If there is evidence of strain, would involve IR for decision making Re: thrombolytics.  - DC loestrin, started 4 months prior to VTE.  - Pt UTD on cervical and breast CA screenings. Due for colon CA screening, urged to pursue this.  - Recheck H/H in AM.  - LE venous U/S positive per preliminary report  Uterine bleeding:  - Follow up with OB/GYN regarding alternative options for Tx while holding estrogen-based Tx.   Hypokalemia: Supplemented.  DVT prophylaxis: Heparin gtt Code Status: Full Family Communication: None at bedside Disposition Plan:  Status is: Inpatient  Remains inpatient appropriate because: Continued need for IV anticoagulation, work up for RV strain.  Consultants:   None  Procedures:  Echocardiogram pending  Antimicrobials: None   Subjective: No dyspnea, but has had cough and some mild left sided chest pain associated only with this. Had subjective fever PTA, none currently. Gums bled with toothbrushing this morning and having some menstrual bleeding currently.   Objective: Vitals:   03/02/21 1600 03/02/21 1600 03/02/21 1718 03/02/21 2300  BP: (!) 165/104  (!) 150/104 (!) 140/93  Pulse: (!) 106  95 91  Resp:    19  Temp:  98.4 F (36.9 C) 98.9 F (37.2 C) 98.6 F (37 C)  TempSrc:  Oral Oral Oral  SpO2: 98%  100% 100%  Weight:      Height:        Intake/Output Summary (Last 24 hours) at 03/03/2021 1218 Last data filed at 03/03/2021 0409 Gross per 24 hour  Intake 1454.27 ml  Output --  Net 1454.27 ml   Filed Weights   03/02/21 0951  Weight: 86.2 kg    Gen: 50 y.o. female in no distress  Pulm: Non-labored breathing room air. Clear to auscultation bilaterally.  CV: Regular rate and rhythm. No murmur, rub, or gallop. No JVD, No pitting pedal edema. GI: Abdomen soft, non-tender, non-distended, with normoactive bowel sounds. No organomegaly or masses felt. Ext: Warm, no deformities Skin: No rashes, lesions or ulcers on visualized skin. Neuro: Alert and oriented. No focal neurological deficits. Psych: Judgement and insight appear normal. Mood & affect appropriate.   Data Reviewed: I have personally reviewed following labs and imaging studies  CBC: Recent Labs  Lab 03/02/21 1020 03/03/21 0246  WBC 7.3 5.6  HGB 13.2 12.7  HCT 39.9 38.1  MCV 93.4 93.2  PLT 242 237   Basic Metabolic Panel: Recent Labs  Lab 03/02/21 1020  NA 136  K 3.2*  CL 105  CO2 22  GLUCOSE 138*  BUN 10  CREATININE 0.72  CALCIUM 8.5*   GFR: Estimated Creatinine Clearance: 93.8 mL/min (by C-G formula based on SCr of 0.72 mg/dL). Liver Function Tests: No results for input(s): AST, ALT, ALKPHOS, BILITOT, PROT, ALBUMIN in the last 168 hours. No  results for input(s): LIPASE, AMYLASE in the last 168 hours. No results for input(s): AMMONIA in the last 168 hours. Coagulation Profile: Recent Labs  Lab 03/02/21 1156  INR 1.0   Cardiac Enzymes: No results for input(s): CKTOTAL, CKMB, CKMBINDEX, TROPONINI in the last 168 hours. BNP (last 3 results) No results for input(s): PROBNP in the last 8760 hours. HbA1C: No results for input(s): HGBA1C in the last 72 hours. CBG: No results for input(s): GLUCAP in the last 168 hours. Lipid Profile: No results for input(s): CHOL, HDL, LDLCALC, TRIG, CHOLHDL, LDLDIRECT in the last 72 hours. Thyroid Function Tests: No results for input(s): TSH, T4TOTAL, FREET4, T3FREE, THYROIDAB in the last 72 hours. Anemia Panel: No results for input(s): VITAMINB12, FOLATE, FERRITIN, TIBC, IRON, RETICCTPCT in the last 72 hours. Urine analysis: No results found for: COLORURINE, APPEARANCEUR, LABSPEC, PHURINE, GLUCOSEU, HGBUR, BILIRUBINUR, KETONESUR, PROTEINUR, UROBILINOGEN, NITRITE, LEUKOCYTESUR Recent Results (from the past 240 hour(s))  Resp Panel by RT-PCR (Flu A&B, Covid) Nasopharyngeal Swab     Status: None   Collection Time: 03/02/21 10:20 AM   Specimen: Nasopharyngeal Swab; Nasopharyngeal(NP) swabs in vial transport medium  Result Value Ref Range Status   SARS Coronavirus 2 by RT PCR NEGATIVE NEGATIVE Final    Comment: (NOTE) SARS-CoV-2 target nucleic acids are NOT DETECTED.  The SARS-CoV-2 RNA is generally detectable in upper respiratory specimens during the acute phase of infection. The lowest concentration of SARS-CoV-2 viral copies this assay can detect is 138 copies/mL. A negative result does not preclude SARS-Cov-2 infection and should not be used as the sole basis for treatment or other patient management decisions. A negative result may occur with  improper specimen collection/handling, submission of specimen other than nasopharyngeal swab, presence of viral mutation(s) within the areas  targeted by this assay, and inadequate number of viral copies(<138 copies/mL). A negative result must be combined with clinical observations, patient history, and epidemiological information. The expected result is Negative.  Fact Sheet for Patients:  BloggerCourse.com  Fact Sheet for Healthcare Providers:  SeriousBroker.it  This test is no t yet approved or cleared by the [place] FDA and  has been authorized for detection and/or diagnosis of SARS-CoV-2 by FDA under an Emergency Use Authorization (EUA). This EUA will remain  in effect (meaning this test can be used) for the duration of the COVID-19 declaration under Section 564(b)(1) of the Act, 21 U.S.C.section 360bbb-3(b)(1), unless the authorization is terminated  or revoked sooner.       Influenza A by PCR NEGATIVE NEGATIVE Final   Influenza B by PCR NEGATIVE NEGATIVE Final    Comment: (NOTE) The Xpert Xpress SARS-CoV-2/FLU/RSV plus assay is intended as an aid in the diagnosis of influenza from Nasopharyngeal swab specimens and should not be used as a sole basis for treatment. Nasal washings and aspirates are unacceptable for Xpert Xpress SARS-CoV-2/FLU/RSV testing.  Fact Sheet for Patients: BloggerCourse.com  Fact Sheet for Healthcare Providers: SeriousBroker.it  This test is not yet approved or cleared by the [place] FDA and has been authorized for detection and/or diagnosis of SARS-CoV-2 by FDA under an Emergency Use Authorization (EUA). This EUA  will remain in effect (meaning this test can be used) for the duration of the COVID-19 declaration under Section 564(b)(1) of the Act, 21 U.S.C. section 360bbb-3(b)(1), unless the authorization is terminated or revoked.  Performed at [facility], [address], [place], [place] [address]       Radiology Studies: CT Angio Chest PE W/Cm &/Or Wo  Cm  Result Date: 03/02/2021 CLINICAL DATA:  Positive D-dimer, cough and shortness of breath. Chest heaviness, fever. EXAM: CT ANGIOGRAPHY CHEST WITH CONTRAST TECHNIQUE: Multidetector CT imaging of the chest was performed using the standard protocol during bolus administration of intravenous contrast. Multiplanar CT image reconstructions and MIPs were obtained to evaluate the vascular anatomy. CONTRAST:  OMNIPAQUE IOHEXOL 350 MG/ML SOLN COMPARISON:  None. FINDINGS: Cardiovascular: There are filling defects in the pulmonary arteries bilaterally, with the most proximal clot seen at the lobar level bilaterally. RV/LV ratio 0.9. Pulmonic trunk and heart are enlarged. No pericardial effusion. Mediastinum/Nodes: Subcentimeter low-attenuation lesion in the right thyroid. No follow-up recommended. (Ref: J Am Coll Radiol. 2015 Feb;12(2): 143-50).No pathologically enlarged mediastinal, hilar or axillary lymph nodes. Esophagus is unremarkable. Lungs/Pleura: Small area of patchy ground-glass and consolidation in the inferior aspect of the posterior left lower lobe. Lungs are otherwise clear. Tiny left pleural effusion. 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 Airway is unremarkable. Upper Abdomen: Visualized portions of the liver, gallbladder, adrenal glands, left kidney, spleen and pancreas are unremarkable. 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 No upper abdominal adenopathy. Musculoskeletal: Mild degenerative changes in the spine. No worrisome lytic or sclerotic lesions. Review of the MIP images confirms the above findings. IMPRESSION: 1. Positive for acute PE with CT evidence of right heart strain (RV/LV Ratio = 0.9) consistent with at least submassive (intermediate risk) PE. The presence of right heart strain has been associated with an increased risk of morbidity and mortality. Please refer to the "PE Focused"  order set in EPIC. Critical Value/emergent results were called by telephone at the time of interpretation on 03/02/2021 at 1:43 pm to provider DR. DYKSTRA, Who verbally acknowledged these results. 2. Probable small area of infarction in the posterior left lower lobe. 3. Tiny left pleural effusion. 4. Enlarged pulmonic trunk, indicative of pulmonary arterial hypertension. Electronically Signed   By: Leanna Battles M.D.   On: 03/02/2021 13:45   DG Chest Port 1 View  Result Date: 03/02/2021 CLINICAL DATA:  Shortness of breath EXAM: PORTABLE CHEST 1 VIEW COMPARISON:  None. FINDINGS: The heart size and mediastinal contours are within normal limits. Both lungs are clear. The visualized skeletal structures are unremarkable. IMPRESSION: No active disease. Electronically Signed   By: Ernie Avena M.D.   On: 03/02/2021 10:23   VAS [place] LOWER EXTREMITY VENOUS (DVT)  Result Date: 03/03/2021  Lower Venous DVT Study Patient Name:  April Herring  Date of Exam:   03/03/2021 Medical Rec #: [id number]       Accession #:    [id number] Date of Birth: [birth year]       Patient Gender: F Patient Age:   18 years Exam Location:  [facility] Procedure:      VAS [place] LOWER EXTREMITY VENOUS (DVT) Referring Phys: MICHAEL NORINS --------------------------------------------------------------------------------  Indications: Pulmonary embolism.  Anticoagulation: Heparin. Comparison Study: No prior studies. Performing Technologist: Jean Rosenthal RDMS, RVT  Examination Guidelines: A complete evaluation includes B-mode imaging, spectral Doppler, color Doppler, and power Doppler  as needed of all accessible portions of each vessel. Bilateral testing is considered an integral part of a complete examination. Limited examinations for reoccurring indications may be performed as noted. The reflux portion of the exam is performed with the patient in reverse Trendelenburg.   +---------+---------------+---------+-----------+----------+--------------+ RIGHT    CompressibilityPhasicitySpontaneityPropertiesThrombus Aging +---------+---------------+---------+-----------+----------+--------------+ CFV      Full           Yes      Yes                                 +---------+---------------+---------+-----------+----------+--------------+ SFJ      Full                                                        +---------+---------------+---------+-----------+----------+--------------+ FV Prox  Full                                                        +---------+---------------+---------+-----------+----------+--------------+ FV Mid   Full                                                        +---------+---------------+---------+-----------+----------+--------------+ FV DistalFull                                                        +---------+---------------+---------+-----------+----------+--------------+ PFV      Full                                                        +---------+---------------+---------+-----------+----------+--------------+ POP      Partial        Yes      Yes                  Acute          +---------+---------------+---------+-----------+----------+--------------+ PTV      Full                                                        +---------+---------------+---------+-----------+----------+--------------+ PERO     Partial        Yes      Yes                  Acute          +---------+---------------+---------+-----------+----------+--------------+ Gastroc  Partial        Yes      Yes  Acute          +---------+---------------+---------+-----------+----------+--------------+   +---------+---------------+---------+-----------+----------+--------------+ LEFT     CompressibilityPhasicitySpontaneityPropertiesThrombus Aging  +---------+---------------+---------+-----------+----------+--------------+ CFV      Full           Yes      Yes                                 +---------+---------------+---------+-----------+----------+--------------+ SFJ      Full                                                        +---------+---------------+---------+-----------+----------+--------------+ FV Prox  Full                                                        +---------+---------------+---------+-----------+----------+--------------+ FV Mid   Full                                                        +---------+---------------+---------+-----------+----------+--------------+ FV DistalFull                                                        +---------+---------------+---------+-----------+----------+--------------+ PFV      Full                                                        +---------+---------------+---------+-----------+----------+--------------+ POP      Full           Yes      Yes                                 +---------+---------------+---------+-----------+----------+--------------+ PTV      Full                                                        +---------+---------------+---------+-----------+----------+--------------+ PERO     Partial        Yes      Yes                  Acute          +---------+---------------+---------+-----------+----------+--------------+ Gastroc  Full                                                        +---------+---------------+---------+-----------+----------+--------------+  Summary: RIGHT: - Findings consistent with acute deep vein thrombosis involving the right popliteal vein, right peroneal veins, and right gastrocnemius veins. - No cystic structure found in the popliteal fossa.  LEFT: - Findings consistent with acute deep vein thrombosis involving the left peroneal veins. - No cystic structure found in the  popliteal fossa.  *See table(s) above for measurements and observations.    Preliminary     Scheduled Meds:  pantoprazole  40 mg Oral BID   Continuous Infusions:  heparin 1,900 Units/hr (03/03/21 0408)     LOS: 1 day   Time spent: 35 minutes.  Tyrone Nine, MD [facility] [email or web address] 03/03/2021, 12:18 PM

## 2021-03-03 NOTE — Progress Notes (Signed)
ANTICOAGULATION CONSULT NOTE - Follow Up Consult  Pharmacy Consult for heparin Indication: pulmonary embolus  Labs: Recent Labs    03/02/21 1020 03/02/21 1149 03/02/21 1156 03/02/21 1237 03/02/21 1520 03/02/21 1938 03/03/21 0246  HGB 13.2  --   --   --   --   --  12.7  HCT 39.9  --   --   --   --   --  38.1  PLT 242  --   --   --   --   --  237  APTT  --  27  --   --   --   --   --   LABPROT  --   --  13.4  --   --   --   --   INR  --   --  1.0  --   --   --   --   HEPARINUNFRC  --   --   --   --  0.95* 0.15* 0.21*  CREATININE 0.72  --   --   --   --   --   --   TROPONINIHS 32*  --   --  29*  --   --   --     Assessment: 50yo female subtherapeutic on heparin after rate change though level is increasing; no infusion issues or signs of bleeding per RN.  Goal of Therapy:  Heparin level 0.3-0.7 units/ml   Plan:  Will rebolus with heparin 3000 units and increase heparin infusion by 3 units/kg/hr to 1900 units/hr and check level in 6 hours.    Vernard Gambles, PharmD, BCPS  03/03/2021,3:51 AM

## 2021-03-03 NOTE — Progress Notes (Signed)
Lower extremity venous bilateral study completed.   Please see CV Proc for preliminary results.   Leyli Kevorkian, RDMS, RVT  

## 2021-03-03 NOTE — TOC Benefit Eligibility Note (Signed)
Patient Product/process development scientist completed.    The patient is currently admitted and upon discharge could be taking Eliquis Starter Pack.  The current 30 day co-pay is, $121.38.   The patient is currently admitted and upon discharge could be taking Xarelto Starter Pack.  The current 30 day co-pay is, $125.00.   The patient is insured through [facility]     April Herring, CPhT Pharmacy Patient Advocate Specialist [facility] Patient Advocate Team Direct Number: [phone]  Fax: [phone]

## 2021-03-04 ENCOUNTER — Other Ambulatory Visit (HOSPITAL_COMMUNITY): Payer: Self-pay

## 2021-03-04 LAB — BASIC METABOLIC PANEL
Anion gap: 9 (ref 5–15)
BUN: 8 mg/dL (ref 6–20)
CO2: 22 mmol/L (ref 22–32)
Calcium: 8.4 mg/dL — ABNORMAL LOW (ref 8.9–10.3)
Chloride: 108 mmol/L (ref 98–111)
Creatinine, Ser: 0.7 mg/dL (ref 0.44–1.00)
GFR, Estimated: >60 mL/min (ref >60)
Glucose, Bld: 94 mg/dL (ref 70–99)
Potassium: 3.6 mmol/L (ref 3.5–5.1)
Sodium: 139 mmol/L (ref 135–145)

## 2021-03-04 LAB — CBC
HCT: 37.6 % (ref 36.0–46.0)
Hemoglobin: 12.2 g/dL (ref 12.0–15.0)
MCH: 30.7 pg (ref 26.0–34.0)
MCHC: 32.4 g/dL (ref 30.0–36.0)
MCV: 94.5 fL (ref 80.0–100.0)
Platelets: 260 10*3/uL (ref 150–400)
RBC: 3.98 MIL/uL (ref 3.87–5.11)
RDW: 11.9 % (ref 11.5–15.5)
WBC: 5.9 10*3/uL (ref 4.0–10.5)
nRBC: 0 % (ref 0.0–0.2)

## 2021-03-04 LAB — HEPARIN LEVEL (UNFRACTIONATED): Heparin Unfractionated: 0.35 IU/mL (ref 0.30–0.70)

## 2021-03-04 MED ORDER — APIXABAN 5 MG PO TABS: 5.0000 mg | ORAL_TABLET | Freq: Two times a day (BID) | ORAL | Status: DC

## 2021-03-04 MED ORDER — APIXABAN 5 MG PO TABS
10.0000 mg | ORAL_TABLET | Freq: Two times a day (BID) | ORAL | Status: DC
  Administered 2021-03-04: 10 mg via ORAL
  Filled 2021-03-04 (×2): qty 2

## 2021-03-04 MED ORDER — APIXABAN (ELIQUIS) VTE STARTER PACK (10MG AND 5MG)
ORAL_TABLET | ORAL | 0 refills | Status: DC
Start: 2021-03-04 — End: 2021-03-28
  Filled 2021-03-04: qty 74, 30d supply, fill #0

## 2021-03-04 NOTE — Discharge Instructions (Signed)
Information on my medicine - ELIQUIS (apixaban)  Why was Eliquis prescribed for you? Eliquis was prescribed to treat blood clots that may have been found in the veins of your legs (deep vein thrombosis) or in your lungs (pulmonary embolism) and to reduce the risk of them occurring again.  What do You need to know about Eliquis ? The starting dose is 10 mg (two 5 mg tablets) taken TWICE daily for the FIRST SEVEN (7) DAYS, then on 03/11/2021  the dose is reduced to ONE 5 mg tablet taken TWICE daily.  Eliquis may be taken with or without food.   Try to take the dose about the same time in the morning and in the evening. If you have difficulty swallowing the tablet whole please discuss with your pharmacist how to take the medication safely.  Take Eliquis exactly as prescribed and DO NOT stop taking Eliquis without talking to the doctor who prescribed the medication.  Stopping may increase your risk of developing a new blood clot.  Refill your prescription before you run out.  After discharge, you should have regular check-up appointments with your healthcare provider that is prescribing your Eliquis.    What do you do if you miss a dose? If a dose of ELIQUIS is not taken at the scheduled time, take it as soon as possible on the same day and twice-daily administration should be resumed. The dose should not be doubled to make up for a missed dose.  Important Safety Information A possible side effect of Eliquis is bleeding. You should call your healthcare provider right away if you experience any of the following: Bleeding from an injury or your nose that does not stop. Unusual colored urine (red or dark brown) or unusual colored stools (red or black). Unusual bruising for unknown reasons. A serious fall or if you hit your head (even if there is no bleeding).  Some medicines may interact with Eliquis and might increase your risk of bleeding or clotting while on Eliquis. To help avoid  this, consult your healthcare provider or pharmacist prior to using any new prescription or non-prescription medications, including herbals, vitamins, non-steroidal anti-inflammatory drugs (NSAIDs) and supplements.  This website has more information on Eliquis (apixaban): http://www.eliquis.com/eliquis/home

## 2021-03-04 NOTE — Progress Notes (Signed)
ANTICOAGULATION CONSULT NOTE - Follow Up Consult  Pharmacy Consult for IV Heparin Indication: pulmonary embolus  No Known Allergies  Patient Measurements: Height: 5\' 7"  (170.2 cm) Weight: 86.2 kg (190 lb) IBW/kg (Calculated) : 61.6 Heparin Dosing Weight: 79.8 kg  Vital Signs: Temp: 99.3 F (37.4 C) (11/11 0551) Temp Source: Oral (11/11 0551) BP: 141/84 (11/11 0551) Pulse Rate: 81 (11/11 0551)  Labs: Recent Labs    03/02/21 1020 03/02/21 1149 03/02/21 1156 03/02/21 1237 03/02/21 1520 03/03/21 0246 03/03/21 0957 03/04/21 0316  HGB 13.2  --   --   --   --  12.7  --  12.2  HCT 39.9  --   --   --   --  38.1  --  37.6  PLT 242  --   --   --   --  237  --  260  APTT  --  27  --   --   --   --   --   --   LABPROT  --   --  13.4  --   --   --   --   --   INR  --   --  1.0  --   --   --   --   --   HEPARINUNFRC  --   --   --   --    < > 0.21* 0.50 0.35  CREATININE 0.72  --   --   --   --   --   --  0.70  TROPONINIHS 32*  --   --  29*  --   --   --   --    < > = values in this interval not displayed.    Estimated Creatinine Clearance: 93.8 mL/min (by C-G formula based on SCr of 0.7 mg/dL).  Medical History: Past Medical History:  Diagnosis Date   COVID-19     Assessment 49 yr old female admitted today with acute PE with RHS; d-dimer 9.57. Pt had COVID in January 2022 and takes low-dose estrogen. Pharmacy was consulted to dose IV  heparin; pt was on no anticoagulants PTA.   Heparin level came back therapeutic at 0.7, on 1900 units/hr. Hgb 12.2, plt 260 - stable. No s/sx of bleeding noted.   Okay to switch to apixaban for DVT/PE - copay $121.38 but has commercial insurance so should be able to use 30 day free and monthly copay card to reduce price.   Goal of Therapy:  Heparin level 0.3-0.7 units/ml Monitor platelets by anticoagulation protocol: Yes   Plan:  Stop heparin infusion Start apixaban 10 mg BID for 7 days then 5 mg BID thererafter  Monitor s/sx of bleeding  and CBC   05-05-2004, PharmD, BCCCP Clinical Pharmacist  Phone: [phone] 03/04/2021 7:38 AM  Please check AMION for all [facility] Pharmacy phone numbers After 10:00 PM, call Main Pharmacy [phone]

## 2021-03-04 NOTE — Progress Notes (Addendum)
Nursing Dc note  Ccmd made aware of dc order. Piv dcd. Both sites unremarkable. Patient verbalized understanding of dc instructions. All belongings given to patient.Toc meds also given to patient.

## 2021-03-04 NOTE — Discharge Summary (Signed)
Physician Discharge Summary  April Herring ZOX:[id number] DOB: November 17, 1970 DOA: 03/02/2021  PCP: Huel Cote, MD  Admit date: 03/02/2021 Discharge date: 03/04/2021  Admitted From: Home Disposition: Home   Recommendations for Outpatient Follow-up:  Follow up with PCP in 1-2 weeks for hospital follow up appointment. Discontinued loestrin due to provoked DVTs and PEs, continue anticoagulation 3-6 months, discharged on eliquis.  Encourage colorectal cancer screening.   Home Health: None Equipment/Devices: None Discharge Condition: Stable CODE STATUS: Full Diet recommendation: Regular  Brief/Interim Summary: April Herring is a 50 y.o. female with a history of perimenopausal symptoms recently started on loestrin and covid-19 infection Jan 2022 who presented to [facility] 11/9 with chest heaviness, cough, fever found to be afebrile, hypertensive, tachycardic, tachypneic without hypoxia, with elevated d-dimer and subsequent CTA chest confirming bilateral submassive PE's with posterior LLL infarct and RV:LV = 0.9 consistent with RV strain. PCCM was consulted, recommended hospitalist admission at [facility]. IV heparin started, echo revealed normal RV systolic function, size, and pressures. Lower extremity DVTs were found on venous U/S (details below). With anticoagulation, the patient has symptomatically improved, remained hemodynamically stable, and has no significant bleeding and stable hemoglobin.   Discharge Diagnoses:  Active Problems:   PE (pulmonary thromboembolism) (HCC)  Provoked bilateral acute DVTs and bilateral submassive PE with RLL pulmonary infarct: Fortunately hemodynamically stable at this time with no hypoxia due to robust reserve. Troponin 32 > 29 consistent with mild strain, though RV:LV 0.9 and pulmonic trunk was enlarged by CT, echo after limited anticoagulation revealed normal RV function/size.  - Transition to eliquis, first dose prior to discharge.  - DC loestrin, started 4 months  prior to VTE.  - Pt UTD on cervical and breast CA screenings. Due for colon CA screening, urged to pursue this.    Uterine bleeding:  - Follow up with OB/GYN regarding alternative options for Tx while holding estrogen-based Tx.    Hypokalemia: Supplemented.  Discharge Instructions Discharge Instructions     Discharge instructions   Complete by: As directed    You were admitted for acute DVTs in the legs and PEs in the lungs. This is thought to have been provoked by loestrin, so this should be stopped. An echocardiogram was reassuring that there is no strain ongoing on your heart, and with stable vital signs and no hypoxia, you are stable for discharge with plans to continue eliquis twice daily. The initial dose is  twice daily for a week (your first dose was given this morning prior to discharge), then down to  twice daily thereafter for at least 3-6 months.   Please follow up with your PCP to discuss alternative treatment options for perimenopausal symptoms, for continued prescriptions for eliquis/management of DVTs, and to discuss colorectal cancer screening. You should also seek medical care right away if you develop new chest pain, shortness of breath, or leg swelling or pain, or if you notice uncontrolled bleeding.      Allergies as of 03/04/2021   No Known Allergies      Medication List     STOP taking these medications    bimatoprost 0.03 % ophthalmic solution Commonly known as: LATISSE   clobetasol 0.05 % topical foam Commonly known as: OLUX   Fluarix Quadrivalent 0.5 ML injection Generic drug: influenza vac split quadrivalent PF   Lo Loestrin Fe 1 MG-10 MCG / 10 MCG tablet Generic drug: Norethindrone-Ethinyl Estradiol-Fe Biphas       TAKE these medications    Apixaban Starter Pack (   and ) Commonly known as: ELIQUIS STARTER PACK Take as directed on package: start with two-5mg  tablets twice daily for 7 days. On day 8, switch to one-5mg  tablet twice  daily.   NASACORT ALLERGY 24HR NA Place 1 spray into both nostrils daily as needed (congestion).   pantoprazole 40 MG tablet Commonly known as: PROTONIX TAKE 1 TABLET BY MOUTH ONCE DAILY. What changed:  how much to take when to take this reasons to take this        Follow-up Information     Huel Cote, MD. Schedule an appointment as soon as possible for a visit in 1 week(s).   Specialty: Obstetrics and Gynecology Contact information: [address] [place] [address] [phone]                No Known Allergies  Consultations: None  Procedures/Studies: CT Angio Chest PE W/Cm &/Or Wo Cm  Result Date: 03/02/2021 CLINICAL DATA:  Positive D-dimer, cough and shortness of breath. Chest heaviness, fever. EXAM: CT ANGIOGRAPHY CHEST WITH CONTRAST TECHNIQUE: Multidetector CT imaging of the chest was performed using the standard protocol during bolus administration of intravenous contrast. Multiplanar CT image reconstructions and MIPs were obtained to evaluate the vascular anatomy. CONTRAST:  OMNIPAQUE IOHEXOL 350 MG/ML SOLN COMPARISON:  None. FINDINGS: Cardiovascular: There are filling defects in the pulmonary arteries bilaterally, with the most proximal clot seen at the lobar level bilaterally. RV/LV ratio 0.9. Pulmonic trunk and heart are enlarged. No pericardial effusion. Mediastinum/Nodes: Subcentimeter low-attenuation lesion in the right thyroid. No follow-up recommended. (Ref: J Am Coll Radiol. 2015 Feb;12(2): 143-50).No pathologically enlarged mediastinal, hilar or axillary lymph nodes. Esophagus is unremarkable. Lungs/Pleura: Small area of patchy ground-glass and consolidation in the inferior aspect of the posterior left lower lobe. Lungs are otherwise clear. Tiny left pleural effusion. 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 Airway is unremarkable. Upper Abdomen:  Visualized portions of the liver, gallbladder, adrenal glands, left kidney, spleen and pancreas are unremarkable. 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 No upper abdominal adenopathy. Musculoskeletal: Mild degenerative changes in the spine. No worrisome lytic or sclerotic lesions. Review of the MIP images confirms the above findings. IMPRESSION: 1. Positive for acute PE with CT evidence of right heart strain (RV/LV Ratio = 0.9) consistent with at least submassive (intermediate risk) PE. The presence of right heart strain has been associated with an increased risk of morbidity and mortality. Please refer to the "PE Focused" order set in EPIC. Critical Value/emergent results were called by telephone at the time of interpretation on 03/02/2021 at 1:43 pm to provider DR. DYKSTRA, Who verbally acknowledged these results. 2. Probable small area of infarction in the posterior left lower lobe. 3. Tiny left pleural effusion. 4. Enlarged pulmonic trunk, indicative of pulmonary arterial hypertension. Electronically Signed   By: Leanna Battles M.D.   On: 03/02/2021 13:45   DG Chest Port 1 View  Result Date: 03/02/2021 CLINICAL DATA:  Shortness of breath EXAM: PORTABLE CHEST 1 VIEW COMPARISON:  None. FINDINGS: The heart size and mediastinal contours are within normal limits. Both lungs are clear. The visualized skeletal structures are unremarkable. IMPRESSION: No active disease. Electronically Signed   By: Ernie Avena M.D.   On: 03/02/2021 10:23   ECHOCARDIOGRAM COMPLETE  Result Date: 03/03/2021    ECHOCARDIOGRAM REPORT   Patient Name:   April Herring Date of Exam: 03/03/2021 Medical Rec #:  [id number]      Height:       67.0 in Accession #:    [id number]     Weight:       190.0 lb Date of Birth:  1970-08-31      BSA:          1.979 m Patient Age:    50 years       BP:           140/93 mmHg Patient Gender: F              HR:           90 bpm. Exam Location:  Inpatient Procedure: 2D Echo,  Cardiac Doppler and Color Doppler Indications:    Pulmonary embolism  History:        Patient has no prior history of Echocardiogram examinations.  Sonographer:    Cleatis Polka Referring Phys: [id number] MICHAEL E NORINS IMPRESSIONS  1. Left ventricular ejection fraction, by estimation, is 60 to 65%. The left ventricle has normal function. The left ventricle has no regional wall motion abnormalities. Left ventricular diastolic parameters were normal.  2. Right ventricular systolic function is normal. The right ventricular size is normal.  3. The mitral valve is normal in structure. No evidence of mitral valve regurgitation. No evidence of mitral stenosis.  4. The aortic valve is normal in structure. Aortic valve regurgitation is not visualized. No aortic stenosis is present.  5. The inferior vena cava is normal in size with greater than 50% respiratory variability, suggesting right atrial pressure of 3 mmHg. Comparison(s): No prior Echocardiogram. FINDINGS  Left Ventricle: Left ventricular ejection fraction, by estimation, is 60 to 65%. The left ventricle has normal function. The left ventricle has no regional wall motion abnormalities. The left ventricular internal cavity size was normal in size. There is  no left ventricular hypertrophy. Left ventricular diastolic parameters were normal. Right Ventricle: The right ventricular size is normal. No increase in right ventricular wall thickness. Right ventricular systolic function is normal. Left Atrium: Left atrial size was normal in size. Right Atrium: Right atrial size was normal in size. Pericardium: There is no evidence of pericardial effusion. Mitral Valve: The mitral valve is normal in structure. No evidence of mitral valve regurgitation. No evidence of mitral valve stenosis. Tricuspid Valve: The tricuspid valve is normal in structure. Tricuspid valve regurgitation is trivial. No evidence of tricuspid stenosis. Aortic Valve: The aortic valve is normal in structure.  Aortic valve regurgitation is not visualized. No aortic stenosis is present. Aortic valve peak gradient measures 8.0 mmHg. Pulmonic Valve: The pulmonic valve was normal in structure. Pulmonic valve regurgitation is not visualized. No evidence of pulmonic stenosis. Aorta: The aortic root is normal in size and structure. Venous: The inferior vena cava is normal in size with greater than 50% respiratory variability, suggesting right atrial pressure of 3 mmHg. IAS/Shunts: No atrial level shunt detected by color flow Doppler.  LEFT VENTRICLE PLAX 2D LVIDd:         5.00 cm      Diastology LVIDs:         3.40 cm      LV e' medial:    7.83 cm/s LV PW:         1.00 cm      LV E/e' medial:  9.8 LV IVS:        0.90 cm      LV e' lateral:   9.03 cm/s LVOT diam:  2.20 cm      LV E/e' lateral: 8.5 LV SV:         77 LV SV Index:   39 LVOT Area:     3.80 cm  LV Volumes (MOD) LV vol d, MOD A2C: 117.0 ml LV vol d, MOD A4C: 112.0 ml LV vol s, MOD A2C: 50.2 ml LV vol s, MOD A4C: 47.0 ml LV SV MOD A2C:     66.8 ml LV SV MOD A4C:     112.0 ml LV SV MOD BP:      65.2 ml RIGHT VENTRICLE             IVC RV Basal diam:  3.50 cm     IVC diam: 1.50 cm RV Mid diam:    2.70 cm RV S prime:     10.90 cm/s TAPSE (M-mode): 2.1 cm LEFT ATRIUM             Index        RIGHT ATRIUM           Index LA diam:        3.90 cm 1.97 cm/m   RA Area:     14.90 cm LA Vol (A2C):   52.3 ml 26.43 ml/m  RA Volume:   38.80 ml  19.61 ml/m LA Vol (A4C):   56.5 ml 28.56 ml/m LA Biplane Vol: 54.9 ml 27.75 ml/m  AORTIC VALVE                 PULMONIC VALVE AV Area (Vmax): 2.83 cm     PR End Diast Vel: 1.34 msec AV Vmax:        141.00 cm/s AV Peak Grad:   8.0 mmHg LVOT Vmax:      105.00 cm/s LVOT Vmean:     77.600 cm/s LVOT VTI:       0.203 m  AORTA Ao Root diam: 2.90 cm Ao Asc diam:  2.90 cm MITRAL VALVE               TRICUSPID VALVE MV Area (PHT): 4.71 cm    TR Peak grad:   13.1 mmHg MV Decel Time: 161 msec    TR Vmax:        181.00 cm/s MV E velocity: 76.50  cm/s MV A velocity: 72.20 cm/s  SHUNTS MV E/A ratio:  1.06        Systemic VTI:  0.20 m                            Systemic Diam: 2.20 cm Kardie Tobb DO Electronically signed by Thomasene Ripple DO Signature Date/Time: 03/03/2021/1:16:18 PM    Final    VAS [place] LOWER EXTREMITY VENOUS (DVT)  Result Date: 03/03/2021  Lower Venous DVT Study Patient Name:  April Herring  Date of Exam:   03/03/2021 Medical Rec #: [id number]       Accession #:    [id number] Date of Birth: Aug 15, 1970       Patient Gender: F Patient Age:   63 years Exam Location:  [facility] Procedure:      VAS [place] LOWER EXTREMITY VENOUS (DVT) Referring Phys: MICHAEL NORINS --------------------------------------------------------------------------------  Indications: Pulmonary embolism.  Anticoagulation: Heparin. Comparison Study: No prior studies. Performing Technologist: Jean Rosenthal RDMS, RVT  Examination Guidelines: A complete evaluation includes B-mode imaging, spectral Doppler, color Doppler, and power Doppler as needed of all accessible portions of each vessel. Bilateral testing  is considered an integral part of a complete examination. Limited examinations for reoccurring indications may be performed as noted. The reflux portion of the exam is performed with the patient in reverse Trendelenburg.  +---------+---------------+---------+-----------+----------+--------------+ RIGHT    CompressibilityPhasicitySpontaneityPropertiesThrombus Aging +---------+---------------+---------+-----------+----------+--------------+ CFV      Full           Yes      Yes                                 +---------+---------------+---------+-----------+----------+--------------+ SFJ      Full                                                        +---------+---------------+---------+-----------+----------+--------------+ FV Prox  Full                                                         +---------+---------------+---------+-----------+----------+--------------+ FV Mid   Full                                                        +---------+---------------+---------+-----------+----------+--------------+ FV DistalFull                                                        +---------+---------------+---------+-----------+----------+--------------+ PFV      Full                                                        +---------+---------------+---------+-----------+----------+--------------+ POP      Partial        Yes      Yes                  Acute          +---------+---------------+---------+-----------+----------+--------------+ PTV      Full                                                        +---------+---------------+---------+-----------+----------+--------------+ PERO     Partial        Yes      Yes                  Acute          +---------+---------------+---------+-----------+----------+--------------+ Gastroc  Partial        Yes      Yes                  Acute          +---------+---------------+---------+-----------+----------+--------------+   +---------+---------------+---------+-----------+----------+--------------+  LEFT     CompressibilityPhasicitySpontaneityPropertiesThrombus Aging +---------+---------------+---------+-----------+----------+--------------+ CFV      Full           Yes      Yes                                 +---------+---------------+---------+-----------+----------+--------------+ SFJ      Full                                                        +---------+---------------+---------+-----------+----------+--------------+ FV Prox  Full                                                        +---------+---------------+---------+-----------+----------+--------------+ FV Mid   Full                                                         +---------+---------------+---------+-----------+----------+--------------+ FV DistalFull                                                        +---------+---------------+---------+-----------+----------+--------------+ PFV      Full                                                        +---------+---------------+---------+-----------+----------+--------------+ POP      Full           Yes      Yes                                 +---------+---------------+---------+-----------+----------+--------------+ PTV      Full                                                        +---------+---------------+---------+-----------+----------+--------------+ PERO     Partial        Yes      Yes                  Acute          +---------+---------------+---------+-----------+----------+--------------+ Gastroc  Full                                                        +---------+---------------+---------+-----------+----------+--------------+  Summary: RIGHT: - Findings consistent with acute deep vein thrombosis involving the right popliteal vein, right peroneal veins, and right gastrocnemius veins. - No cystic structure found in the popliteal fossa.  LEFT: - Findings consistent with acute deep vein thrombosis involving the left peroneal veins. - No cystic structure found in the popliteal fossa.  *See table(s) above for measurements and observations. Electronically signed by Sherald Hess MD on 03/03/2021 at 3:38:03 PM.    Final     Subjective: No shortness of breath, no bleeding reported today, chest discomfort with cough might be subsiding.  Discharge Exam: Vitals:   03/03/21 2141 03/04/21 0551  BP: 139/80 (!) 141/84  Pulse: 91 81  Resp: 18 16  Temp: 98.9 F (37.2 C) 99.3 F (37.4 C)  SpO2: 95%    General: Pt is alert, awake, not in acute distress Cardiovascular: RRR, S1/S2 +, no rubs, no gallops Respiratory: CTA bilaterally, no wheezing, no  rhonchi Abdominal: Soft, NT, ND, bowel sounds + Extremities: No edema, no cyanosis  Labs: BNP (last 3 results) Recent Labs    03/02/21 1020  BNP 41.4   Basic Metabolic Panel: Recent Labs  Lab 03/02/21 1020 03/04/21 0316  NA 136 139  K 3.2* 3.6  CL 105 108  CO2 22 22  GLUCOSE 138* 94  BUN 10 8  CREATININE 0.72 0.70  CALCIUM 8.5* 8.4*   CBC: Recent Labs  Lab 03/02/21 1020 03/03/21 0246 03/04/21 0316  WBC 7.3 5.6 5.9  HGB 13.2 12.7 12.2  HCT 39.9 38.1 37.6  MCV 93.4 93.2 94.5  PLT 242 237 260   Microbiology Recent Results (from the past 240 hour(s))  Resp Panel by RT-PCR (Flu A&B, Covid) Nasopharyngeal Swab     Status: None   Collection Time: 03/02/21 10:20 AM   Specimen: Nasopharyngeal Swab; Nasopharyngeal(NP) swabs in vial transport medium  Result Value Ref Range Status   SARS Coronavirus 2 by RT PCR NEGATIVE NEGATIVE Final   Influenza A by PCR NEGATIVE NEGATIVE Final   Influenza B by PCR NEGATIVE NEGATIVE Final    Time coordinating discharge: Approximately 40 minutes  Tyrone Nine, MD  [facility] 03/04/2021, 10:18 AM

## 2021-03-07 ENCOUNTER — Encounter: Payer: Self-pay | Admitting: *Deleted

## 2021-03-07 ENCOUNTER — Other Ambulatory Visit: Payer: Self-pay | Admitting: *Deleted

## 2021-03-07 NOTE — Patient Outreach (Signed)
[facility]) Care Management  03/07/2021  April Herring 07/08/1970 [id number]     Transition of care call/case closure   Referral received:03/03/21 Initial outreach:03/07/21 Insurance: [place] [employer]    Subjective: Initial successful telephone call to patient's preferred number in order to complete transition of care assessment; 2 HIPAA identifiers verified. Explained purpose of call and completed transition of care assessment.  April Herring states that she is doing good. She denies shortness of breath chest discomfort, no signs symptoms of bleeding.  She is tolerating diet, denies bowel or bladder problems. She discussed making contact with OB/GYN regarding getting a referral for hematology to follow her since being on Eliquis .   Reviewed accessing the following [employer] : Provided contact number for  find a doctor for PCP.  Referred to contacting [facility] customer services if question regarding plan .      Objective:  [name]   was hospitalized at [facility] 11/9-11/11/22 for  Pulmonary embolus, DVT . She was discharged to home on 03/04/21  without the need for home health services or durable medical equipment per the discharge summary.   Assessment:  Patient voices good understanding of all discharge instructions.  See transition of care flowsheet for assessment details.   Plan:  Reviewed hospital discharge diagnosis of Pulmonary Embolus    and discharge treatment plan using hospital discharge instructions, assessing medication adherence, reviewing problems requiring provider notification, and discussing the importance of follow up with surgeon, primary care provider and/or specialists as directed.  No ongoing care management needs identified so will close case to [facility] Care Management services and route successful outreach letter with [facility] Care Management pamphlet and 24 Hour Nurse Line  [employer] to [employer] Care Management clinical pool to be mailed to patient's home address.  Thanked patient for their services to [facility].   Egbert Garibaldi, RN, BSN  [facility] Care Management,Care Management Coordinator  [phone]- Mobile [phone]- Toll Free Main Office

## 2021-03-08 ENCOUNTER — Telehealth: Payer: Self-pay | Admitting: Hematology

## 2021-03-08 ENCOUNTER — Other Ambulatory Visit (HOSPITAL_BASED_OUTPATIENT_CLINIC_OR_DEPARTMENT_OTHER): Payer: Self-pay

## 2021-03-08 MED ORDER — PFIZER COVID-19 VAC BIVALENT 30 MCG/0.3ML IM SUSP
INTRAMUSCULAR | 0 refills | Status: DC
  Filled 2021-03-08: qty 0.3, 1d supply, fill #0

## 2021-03-08 NOTE — Telephone Encounter (Signed)
Scheduled appt per 11/14 referral. Pt is aware of appt date and time.  

## 2021-03-15 DIAGNOSIS — R87610 Atypical squamous cells of undetermined significance on cytologic smear of cervix (ASC-US): Secondary | ICD-10-CM | POA: Diagnosis not present

## 2021-03-15 DIAGNOSIS — D689 Coagulation defect, unspecified: Secondary | ICD-10-CM | POA: Diagnosis not present

## 2021-03-15 DIAGNOSIS — Z130 Encounter for screening for diseases of the blood and blood-forming organs and certain disorders involving the immune mechanism: Secondary | ICD-10-CM | POA: Diagnosis not present

## 2021-03-15 DIAGNOSIS — Z1389 Encounter for screening for other disorder: Secondary | ICD-10-CM | POA: Diagnosis not present

## 2021-03-15 DIAGNOSIS — Z6829 Body mass index (BMI) 29.0-29.9, adult: Secondary | ICD-10-CM | POA: Diagnosis not present

## 2021-03-15 DIAGNOSIS — I2699 Other pulmonary embolism without acute cor pulmonale: Secondary | ICD-10-CM | POA: Diagnosis not present

## 2021-03-15 DIAGNOSIS — Z113 Encounter for screening for infections with a predominantly sexual mode of transmission: Secondary | ICD-10-CM | POA: Diagnosis not present

## 2021-03-15 DIAGNOSIS — Z1211 Encounter for screening for malignant neoplasm of colon: Secondary | ICD-10-CM | POA: Diagnosis not present

## 2021-03-15 DIAGNOSIS — Z124 Encounter for screening for malignant neoplasm of cervix: Secondary | ICD-10-CM | POA: Diagnosis not present

## 2021-03-15 DIAGNOSIS — Z1151 Encounter for screening for human papillomavirus (HPV): Secondary | ICD-10-CM | POA: Diagnosis not present

## 2021-03-15 DIAGNOSIS — Z01419 Encounter for gynecological examination (general) (routine) without abnormal findings: Secondary | ICD-10-CM | POA: Diagnosis not present

## 2021-03-15 LAB — HM HEPATITIS C SCREENING LAB: HM Hepatitis Screen: NEGATIVE

## 2021-03-18 ENCOUNTER — Other Ambulatory Visit (HOSPITAL_COMMUNITY): Payer: Self-pay

## 2021-03-21 ENCOUNTER — Other Ambulatory Visit (HOSPITAL_COMMUNITY): Payer: Self-pay

## 2021-03-21 ENCOUNTER — Telehealth (HOSPITAL_COMMUNITY): Payer: Self-pay

## 2021-03-21 NOTE — Telephone Encounter (Signed)
Second attempt to contact April Herring for a follow up conversation.

## 2021-03-28 ENCOUNTER — Other Ambulatory Visit: Payer: Self-pay

## 2021-03-28 ENCOUNTER — Inpatient Hospital Stay: Payer: 59

## 2021-03-28 ENCOUNTER — Inpatient Hospital Stay: Payer: 59 | Attending: Hematology | Admitting: Hematology

## 2021-03-28 ENCOUNTER — Other Ambulatory Visit (HOSPITAL_BASED_OUTPATIENT_CLINIC_OR_DEPARTMENT_OTHER): Payer: Self-pay

## 2021-03-28 VITALS — BP 149/94 | HR 98 | Temp 98.1°F | Resp 20 | Wt 191.3 lb

## 2021-03-28 DIAGNOSIS — I82452 Acute embolism and thrombosis of left peroneal vein: Secondary | ICD-10-CM

## 2021-03-28 DIAGNOSIS — Z7901 Long term (current) use of anticoagulants: Secondary | ICD-10-CM | POA: Diagnosis not present

## 2021-03-28 DIAGNOSIS — R87619 Unspecified abnormal cytological findings in specimens from cervix uteri: Secondary | ICD-10-CM | POA: Diagnosis not present

## 2021-03-28 DIAGNOSIS — N879 Dysplasia of cervix uteri, unspecified: Secondary | ICD-10-CM | POA: Diagnosis not present

## 2021-03-28 DIAGNOSIS — I824Y3 Acute embolism and thrombosis of unspecified deep veins of proximal lower extremity, bilateral: Secondary | ICD-10-CM

## 2021-03-28 DIAGNOSIS — I2699 Other pulmonary embolism without acute cor pulmonale: Secondary | ICD-10-CM

## 2021-03-28 DIAGNOSIS — D6859 Other primary thrombophilia: Secondary | ICD-10-CM

## 2021-03-28 DIAGNOSIS — I82431 Acute embolism and thrombosis of right popliteal vein: Secondary | ICD-10-CM | POA: Diagnosis not present

## 2021-03-28 DIAGNOSIS — N841 Polyp of cervix uteri: Secondary | ICD-10-CM | POA: Diagnosis not present

## 2021-03-28 LAB — CBC WITH DIFFERENTIAL/PLATELET
Abs Immature Granulocytes: 0.01 10*3/uL (ref 0.00–0.07)
Basophils Absolute: 0 10*3/uL (ref 0.0–0.1)
Basophils Relative: 1 %
Eosinophils Absolute: 0.2 10*3/uL (ref 0.0–0.5)
Eosinophils Relative: 3 %
HCT: 39.6 % (ref 36.0–46.0)
Hemoglobin: 13 g/dL (ref 12.0–15.0)
Immature Granulocytes: 0 %
Lymphocytes Relative: 26 %
Lymphs Abs: 1.7 10*3/uL (ref 0.7–4.0)
MCH: 30.2 pg (ref 26.0–34.0)
MCHC: 32.8 g/dL (ref 30.0–36.0)
MCV: 91.9 fL (ref 80.0–100.0)
Monocytes Absolute: 0.4 10*3/uL (ref 0.1–1.0)
Monocytes Relative: 6 %
Neutro Abs: 4.2 10*3/uL (ref 1.7–7.7)
Neutrophils Relative %: 64 %
Platelets: 226 10*3/uL (ref 150–400)
RBC: 4.31 MIL/uL (ref 3.87–5.11)
RDW: 12.7 % (ref 11.5–15.5)
WBC: 6.6 10*3/uL (ref 4.0–10.5)
nRBC: 0 % (ref 0.0–0.2)

## 2021-03-28 LAB — IRON AND TIBC
Iron: 74 ug/dL (ref 41–142)
Saturation Ratios: 17 % — ABNORMAL LOW (ref 21–57)
TIBC: 428 ug/dL (ref 236–444)
UIBC: 354 ug/dL (ref 120–384)

## 2021-03-28 LAB — CMP (CANCER CENTER ONLY)
ALT: 48 U/L — ABNORMAL HIGH (ref 0–44)
AST: 25 U/L (ref 15–41)
Albumin: 4 g/dL (ref 3.5–5.0)
Alkaline Phosphatase: 88 U/L (ref 38–126)
Anion gap: 10 (ref 5–15)
BUN: 16 mg/dL (ref 6–20)
CO2: 23 mmol/L (ref 22–32)
Calcium: 8.8 mg/dL — ABNORMAL LOW (ref 8.9–10.3)
Chloride: 107 mmol/L (ref 98–111)
Creatinine: 0.73 mg/dL (ref 0.44–1.00)
GFR, Estimated: >60 mL/min (ref >60)
Glucose, Bld: 89 mg/dL (ref 70–99)
Potassium: 3.9 mmol/L (ref 3.5–5.1)
Sodium: 140 mmol/L (ref 135–145)
Total Bilirubin: 0.5 mg/dL (ref 0.3–1.2)
Total Protein: 7.4 g/dL (ref 6.5–8.1)

## 2021-03-28 LAB — ANTITHROMBIN III: AntiThromb III Func: 116 % (ref 75–120)

## 2021-03-28 LAB — FERRITIN: Ferritin: 43 ng/mL (ref 11–307)

## 2021-03-28 LAB — HM PAP SMEAR

## 2021-03-28 LAB — RESULTS CONSOLE HPV: CHL HPV: NEGATIVE

## 2021-03-28 MED ORDER — APIXABAN 5 MG PO TABS
5.0000 mg | ORAL_TABLET | Freq: Two times a day (BID) | ORAL | 4 refills | Status: DC
  Filled 2021-03-28: qty 60, 30d supply, fill #0
  Filled 2021-04-29: qty 60, 30d supply, fill #1
  Filled 2021-05-30: qty 60, 30d supply, fill #2
  Filled 2021-07-05: qty 60, 30d supply, fill #3
  Filled 2021-08-05: qty 60, 30d supply, fill #4

## 2021-03-28 NOTE — Progress Notes (Addendum)
Marland Kitchen   HEMATOLOGY/ONCOLOGY CONSULTATION NOTE  Date of Service: 03/28/2021  Patient Care Team: Paula Compton, MD as PCP - General (Obstetrics and Gynecology)  CHIEF COMPLAINTS/PURPOSE OF CONSULTATION:  DVT and Acute PE  HISTORY OF PRESENTING ILLNESS:   April Herring is a wonderful 50 y.o. female who has been referred to [place] by Ray Church, MD  for evaluation and management of DVT and acute PE  Patient is a overall healthy individual with no significant chronic medical comorbidities with a history of COVID-19 infection in January 2022. She notes she had significant perimenopausal symptoms and was started on Loestrin about 3-4 months prior to her presentation at [facility] 03/02/2021 with chest tightness, cough subjective fevers, hypertension tachycardia and tachypnea without hypoxia. Patient thought she might have a flulike illness.  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 Patient had an echocardiogram that showed normal right ventricular systolic function size and pressures. Lower extremity ultrasounds 03/03/2021 showed acute DVT involving the right popliteal vein, right peroneal veins and right gastrocnemius veins.  Findings consistent with acute DVT involving the left peroneal veins.  Patient was treated with IV heparin and transition to Eliquis prior to discharge. Patient notes improvement and resolution of her shortness of breath and fever-like symptoms.  No current chest pain.  Her DVTs and PE was noted to be likely provoked in the context of perimenopausal hormonal exposure but given the extent of the DVT and PE she is here to discuss whether additional work-up is necessary.  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  Recent long distance travel or immobility. No history of  heavy steroid use. No family history of thrombophilia, DVT or PE. No previous history of VTE Patient notes she was on hormonal birth control pills in her 87s without any previous history of VTE. Patient denies being a smoker. No significant issues with varicose veins or chronic venous insufficiency. Patient notes no other new focal symptoms in the last 3 to 6 months suggestive of malignancy.  She is up-to-date on cervical and breast cancer screening and is due for her first colonoscopy.  She notes she has follow-up with OB/GYN to discuss alternative options to address her perimenopausal symptoms.  MEDICAL HISTORY:  Past Medical History:  Diagnosis Date   COVID-19    Pulmonary embolus [facility])     SURGICAL HISTORY: Past Surgical History:  Procedure Laterality Date   BREAST BIOPSY Left 06/13/2013   benign    SOCIAL HISTORY: Social History   Socioeconomic History   Marital status: Married    Spouse name: Not on file   Number of children: Not on file   Years of education: Not on file   Highest education level: Not on file  Occupational History   Not on file  Tobacco Use   Smoking status: Never   Smokeless tobacco: Never  Substance and Sexual Activity   Alcohol use: Yes    Comment: social   Drug use: Never   Sexual activity: Not on file  Other Topics Concern   Not on file  Social History Narrative   Not on file   Social Determinants of Health   Financial Resource Strain: Not on file  Food Insecurity: Not on file  Transportation Needs: Not on file  Physical Activity: Not on file  Stress: Not on file  Social Connections: Not on file  Intimate Partner Violence: Not on file    FAMILY HISTORY: Family History  Problem Relation Age of Onset   Breast cancer Maternal Aunt    Breast cancer Maternal Grandmother     ALLERGIES:  has No Known Allergies.  MEDICATIONS:  Current Outpatient Medications  Medication Sig Dispense Refill   APIXABAN (ELIQUIS) VTE STARTER  PACK (10MG  AND 5MG ) Take as directed on package: start with two-5mg  tablets twice daily for 7 days. On day 8, switch to one-5mg  tablet twice daily. 74 each 0   COVID-19 mRNA bivalent vaccine, Pfizer, (PFIZER COVID-19 VAC BIVALENT) injection Inject into the muscle. 0.3 mL 0   pantoprazole (PROTONIX) 40 MG tablet TAKE 1 TABLET BY MOUTH ONCE DAILY. (Patient taking differently: Take 40 mg by mouth daily as needed (heart burn).) 90 tablet 5   Triamcinolone Acetonide (NASACORT ALLERGY 24HR NA) Place 1 spray into both nostrils daily as needed (congestion).     No current facility-administered medications for this visit.    REVIEW OF SYSTEMS:    10 Point review of Systems was done is negative except as noted above.  PHYSICAL EXAMINATION: ECOG PERFORMANCE STATUS: 1 - Symptomatic but completely ambulatory  . Vitals:   03/28/21 1245  BP: (!) 149/94  Pulse: 98  Resp: 20  Temp: 98.1 F (36.7 C)  SpO2: 97%   Filed Weights   03/28/21 1245  Weight: 191 lb 4.8 oz (86.8 kg)   .Body mass index is 29.96 kg/m.  GENERAL:alert, in no acute distress and comfortable SKIN: no acute rashes, no significant lesions EYES: conjunctiva are pink and non-injected, sclera anicteric OROPHARYNX: MMM, no exudates, no oropharyngeal erythema or ulceration NECK: supple, no JVD LYMPH:  no palpable lymphadenopathy in the cervical, axillary or inguinal regions LUNGS: clear to auscultation b/l with normal respiratory effort HEART: regular rate & rhythm ABDOMEN:  normoactive bowel sounds , non tender, not distended. Extremity: no pedal edema PSYCH: alert & oriented x 3 with fluent speech NEURO: no focal motor/sensory deficits  LABORATORY DATA:  I have reviewed the data as listed  . CBC Latest Ref Rng & Units 03/04/2021 03/03/2021 03/02/2021  WBC 4.0 - 10.5 K/uL 5.9 5.6 7.3  Hemoglobin 12.0 - 15.0 g/dL 12.2 12.7 13.2  Hematocrit 36.0 - 46.0 % 37.6 38.1 39.9  Platelets 150 - 400 K/uL 260 237 242    . CMP  Latest Ref Rng & Units 03/04/2021 03/02/2021  Glucose 70 - 99 mg/dL 94 138(H)  BUN 6 - 20 mg/dL 8 10  Creatinine 0.44 - 1.00 mg/dL 0.70 0.72  Sodium 135 - 145 mmol/L 139 136  Potassium 3.5 - 5.1 mmol/L 3.6 3.2(L)  Chloride 98 - 111 mmol/L 108 105  CO2 22 - 32 mmol/L 22 22  Calcium 8.9 - 10.3 mg/dL 8.4(L) 8.5(L)     RADIOGRAPHIC STUDIES: I have personally reviewed the radiological images as listed and agreed with the findings in the report. CT Angio Chest PE W/Cm &/Or Wo Cm  Result Date: 03/02/2021 CLINICAL DATA:  Positive D-dimer, cough and shortness of breath. Chest heaviness, fever. EXAM: CT ANGIOGRAPHY CHEST WITH CONTRAST TECHNIQUE: Multidetector CT imaging of the chest was performed using the standard protocol during bolus administration of intravenous contrast. Multiplanar CT image reconstructions and MIPs were obtained to evaluate the vascular anatomy. CONTRAST:  17mL OMNIPAQUE IOHEXOL 350 MG/ML SOLN COMPARISON:  None. FINDINGS: Cardiovascular: There are filling defects in the pulmonary arteries bilaterally, with the most proximal clot seen at the lobar  level bilaterally. RV/LV ratio 0.9. Pulmonic trunk and heart are enlarged. No pericardial effusion. Mediastinum/Nodes: Subcentimeter low-attenuation lesion in the right thyroid. No follow-up recommended. (Ref: J Am Coll Radiol. 2015 Feb;12(2): 143-50).No pathologically enlarged mediastinal, hilar or axillary lymph nodes. Esophagus is unremarkable. Lungs/Pleura: Small area of patchy ground-glass and consolidation in the inferior aspect of the posterior left lower lobe. Lungs are otherwise clear. Tiny left pleural effusion. 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 Airway is unremarkable. Upper Abdomen: Visualized portions of the liver, gallbladder, adrenal glands, left kidney, spleen and pancreas are unremarkable. 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 No upper abdominal adenopathy. Musculoskeletal: Mild degenerative changes in the spine. No worrisome lytic or sclerotic lesions. Review of the MIP images confirms the above findings. IMPRESSION: 1. Positive for acute PE with CT evidence of right heart strain (RV/LV Ratio = 0.9) consistent with at least submassive (intermediate risk) PE. The presence of right heart strain has been associated with an increased risk of morbidity and mortality. Please refer to the "PE Focused" order set in EPIC. Critical Value/emergent results were called by telephone at the time of interpretation on 03/02/2021 at 1:43 pm to provider DR. DYKSTRA, Who verbally acknowledged these results. 2. Probable small area of infarction in the posterior left lower lobe. 3. Tiny left pleural effusion. 4. Enlarged pulmonic trunk, indicative of pulmonary arterial hypertension. Electronically Signed   By: Lorin Picket M.D.   On: 03/02/2021 13:45   DG Chest Port 1 View  Result Date: 03/02/2021 CLINICAL DATA:  Shortness of breath EXAM: PORTABLE CHEST 1 VIEW COMPARISON:  None. FINDINGS: The heart size and mediastinal contours are within normal limits. Both lungs are clear. The visualized skeletal structures are unremarkable. IMPRESSION: No active disease. Electronically Signed   By: Elmer Picker M.D.   On: 03/02/2021 10:23   ECHOCARDIOGRAM COMPLETE  Result Date: 03/03/2021    ECHOCARDIOGRAM REPORT   Patient Name:   April Herring Date of Exam: 03/03/2021 Medical Rec #:  [id number]      Height:       67.0 in Accession #:    [id number]     Weight:       190.0 lb Date of Birth:  Jun 18, 1970      BSA:          1.979 m Patient Age:    54 years       BP:           140/93 mmHg Patient Gender: F              HR:           90 bpm. Exam Location:  Inpatient Procedure: 2D Echo, Cardiac Doppler and Color Doppler Indications:    Pulmonary embolism  History:        Patient has no prior history of Echocardiogram examinations.  Sonographer:     Jyl Heinz Referring Phys: [place]  1. Left ventricular ejection fraction, by estimation, is 60 to 65%. The left ventricle has normal function. The left ventricle has no regional wall motion abnormalities. Left ventricular diastolic parameters were normal.  2. Right ventricular systolic function is normal. The right ventricular size is normal.  3. The mitral valve is normal in structure. No evidence of mitral valve regurgitation. No evidence of mitral stenosis.  4. The aortic valve is normal in structure. Aortic valve regurgitation is not visualized. No  aortic stenosis is present.  5. The inferior vena cava is normal in size with greater than 50% respiratory variability, suggesting right atrial pressure of 3 mmHg. Comparison(s): No prior Echocardiogram. FINDINGS  Left Ventricle: Left ventricular ejection fraction, by estimation, is 60 to 65%. The left ventricle has normal function. The left ventricle has no regional wall motion abnormalities. The left ventricular internal cavity size was normal in size. There is  no left ventricular hypertrophy. Left ventricular diastolic parameters were normal. Right Ventricle: The right ventricular size is normal. No increase in right ventricular wall thickness. Right ventricular systolic function is normal. Left Atrium: Left atrial size was normal in size. Right Atrium: Right atrial size was normal in size. Pericardium: There is no evidence of pericardial effusion. Mitral Valve: The mitral valve is normal in structure. No evidence of mitral valve regurgitation. No evidence of mitral valve stenosis. Tricuspid Valve: The tricuspid valve is normal in structure. Tricuspid valve regurgitation is trivial. No evidence of tricuspid stenosis. Aortic Valve: The aortic valve is normal in structure. Aortic valve regurgitation is not visualized. No aortic stenosis is present. Aortic valve peak gradient measures 8.0 mmHg. Pulmonic Valve: The pulmonic valve was  normal in structure. Pulmonic valve regurgitation is not visualized. No evidence of pulmonic stenosis. Aorta: The aortic root is normal in size and structure. Venous: The inferior vena cava is normal in size with greater than 50% respiratory variability, suggesting right atrial pressure of 3 mmHg. IAS/Shunts: No atrial level shunt detected by color flow Doppler.  LEFT VENTRICLE PLAX 2D LVIDd:         5.00 cm      Diastology LVIDs:         3.40 cm      LV e' medial:    7.83 cm/s LV PW:         1.00 cm      LV E/e' medial:  9.8 LV IVS:        0.90 cm      LV e' lateral:   9.03 cm/s LVOT diam:     2.20 cm      LV E/e' lateral: 8.5 LV SV:         77 LV SV Index:   39 LVOT Area:     3.80 cm  LV Volumes (MOD) LV vol d, MOD A2C: 117.0 ml LV vol d, MOD A4C: 112.0 ml LV vol s, MOD A2C: 50.2 ml LV vol s, MOD A4C: 47.0 ml LV SV MOD A2C:     66.8 ml LV SV MOD A4C:     112.0 ml LV SV MOD BP:      65.2 ml RIGHT VENTRICLE             IVC RV Basal diam:  3.50 cm     IVC diam: 1.50 cm RV Mid diam:    2.70 cm RV S prime:     10.90 cm/s TAPSE (M-mode): 2.1 cm LEFT ATRIUM             Index        RIGHT ATRIUM           Index LA diam:        3.90 cm 1.97 cm/m   RA Area:     14.90 cm LA Vol (A2C):   52.3 ml 26.43 ml/m  RA Volume:   38.80 ml  19.61 ml/m LA Vol (A4C):   56.5 ml 28.56 ml/m LA Biplane Vol: 54.9 ml 27.75 ml/m  AORTIC VALVE                 PULMONIC VALVE AV Area (Vmax): 2.83 cm     PR End Diast Vel: 1.34 msec AV Vmax:        141.00 cm/s AV Peak Grad:   8.0 mmHg LVOT Vmax:      105.00 cm/s LVOT Vmean:     77.600 cm/s LVOT VTI:       0.203 m  AORTA Ao Root diam: 2.90 cm Ao Asc diam:  2.90 cm MITRAL VALVE               TRICUSPID VALVE MV Area (PHT): 4.71 cm    TR Peak grad:   13.1 mmHg MV Decel Time: 161 msec    TR Vmax:        181.00 cm/s MV E velocity: 76.50 cm/s MV A velocity: 72.20 cm/s  SHUNTS MV E/A ratio:  1.06        Systemic VTI:  0.20 m                            Systemic Diam: 2.20 cm Kardie Tobb DO  Electronically signed by Berniece Salines DO Signature Date/Time: 03/03/2021/1:16:18 PM    Final    VAS [place] LOWER EXTREMITY VENOUS (DVT)  Result Date: 03/03/2021  Lower Venous DVT Study Patient Name:  VICTORIALYNN SANKARAN  Date of Exam:   03/03/2021 Medical Rec #: [id number]       Accession #:    [id number] Date of Birth: Mar 24, 1971       Patient Gender: F Patient Age:   26 years Exam Location:  [facility] Procedure:      VAS [place] LOWER EXTREMITY VENOUS (DVT) Referring Phys: MICHAEL NORINS --------------------------------------------------------------------------------  Indications: Pulmonary embolism.  Anticoagulation: Heparin. Comparison Study: No prior studies. Performing Technologist: Darlin Coco RDMS, RVT  Examination Guidelines: A complete evaluation includes B-mode imaging, spectral Doppler, color Doppler, and power Doppler as needed of all accessible portions of each vessel. Bilateral testing is considered an integral part of a complete examination. Limited examinations for reoccurring indications may be performed as noted. The reflux portion of the exam is performed with the patient in reverse Trendelenburg.  +---------+---------------+---------+-----------+----------+--------------+ RIGHT    CompressibilityPhasicitySpontaneityPropertiesThrombus Aging +---------+---------------+---------+-----------+----------+--------------+ CFV      Full           Yes      Yes                                 +---------+---------------+---------+-----------+----------+--------------+ SFJ      Full                                                        +---------+---------------+---------+-----------+----------+--------------+ FV Prox  Full                                                        +---------+---------------+---------+-----------+----------+--------------+ FV Mid   Full                                                         +---------+---------------+---------+-----------+----------+--------------+  FV DistalFull                                                        +---------+---------------+---------+-----------+----------+--------------+ PFV      Full                                                        +---------+---------------+---------+-----------+----------+--------------+ POP      Partial        Yes      Yes                  Acute          +---------+---------------+---------+-----------+----------+--------------+ PTV      Full                                                        +---------+---------------+---------+-----------+----------+--------------+ PERO     Partial        Yes      Yes                  Acute          +---------+---------------+---------+-----------+----------+--------------+ Gastroc  Partial        Yes      Yes                  Acute          +---------+---------------+---------+-----------+----------+--------------+   +---------+---------------+---------+-----------+----------+--------------+ LEFT     CompressibilityPhasicitySpontaneityPropertiesThrombus Aging +---------+---------------+---------+-----------+----------+--------------+ CFV      Full           Yes      Yes                                 +---------+---------------+---------+-----------+----------+--------------+ SFJ      Full                                                        +---------+---------------+---------+-----------+----------+--------------+ FV Prox  Full                                                        +---------+---------------+---------+-----------+----------+--------------+ FV Mid   Full                                                        +---------+---------------+---------+-----------+----------+--------------+ FV DistalFull                                                         +---------+---------------+---------+-----------+----------+--------------+  PFV      Full                                                        +---------+---------------+---------+-----------+----------+--------------+ POP      Full           Yes      Yes                                 +---------+---------------+---------+-----------+----------+--------------+ PTV      Full                                                        +---------+---------------+---------+-----------+----------+--------------+ PERO     Partial        Yes      Yes                  Acute          +---------+---------------+---------+-----------+----------+--------------+ Gastroc  Full                                                        +---------+---------------+---------+-----------+----------+--------------+     Summary: RIGHT: - Findings consistent with acute deep vein thrombosis involving the right popliteal vein, right peroneal veins, and right gastrocnemius veins. - No cystic structure found in the popliteal fossa.  LEFT: - Findings consistent with acute deep vein thrombosis involving the left peroneal veins. - No cystic structure found in the popliteal fossa.  *See table(s) above for measurements and observations. Electronically signed by Monica Martinez MD on 03/03/2021 at 3:38:03 PM.    Final     ASSESSMENT & PLAN:   50 year old in good overall health with  1) Submassive bilateral PEs with concern for right ventricular strain and 2) bilateral lower extremity DVTs In November 2022. PLAN -Patient's hospital medical records imaging studies including CT of the chest and ultrasound of the lower extremities were reviewed in detail with her. -We did an extensive personal and family history to evaluate for any pattern of hereditary thrombophilias. -No symptoms suggestive of malignancy. -We discussed that her use of Loestrin is certainly a significant potential provoking factor for her  VTE in the context of estrogen use in perimenopausal/postmenopausal situation. -We would not recommend any systemic use of estrogen or progesterone's at this time. -Patient is on Eliquis with resolving symptoms would continue on Eliquis at this time. -New prescription given for Eliquis to continue on 5mg  p.o. twice daily after her starter pack is completed. -Duration of anticoagulation will depend on results of thrombophilia work-up. -We discussed that even though she has obvious provoking factor for her VTE the extent of the clot suggests it would be reasonable to do a thrombophilia work-up to rule out other additional associated risk factors. -After discussing the pros and cons of doing a thrombophilia work-up patient is agreeable and thrombophilia work-up was sent as noted below. -Patient has previously been having heavier periods  and will check iron studies. -She will follow-up with her OB/GYN doctor to discuss alternative nonhormonal approaches to treatment of her perimenopausal symptoms. -She is up-to-date with her cervical cancer and breast cancer screening but is due for colon cancer screening which she shall follow-up with her primary care physician to address. -Patient had several good questions which were answered in details. -We discussed general methodologies to reduce risk of VTE including maintaining good hydration, precautions with long distance travel and staying active, use of compression socks etc..  Follow-up Labs today Phone visit with Dr Candise Che in 10 days  Orders Placed This Encounter  Procedures   CBC with Differential/Platelet    Standing Status:   Future    Number of Occurrences:   1    Standing Expiration Date:   03/28/2022   CMP (Cancer Center only)    Standing Status:   Future    Number of Occurrences:   1    Standing Expiration Date:   03/28/2022   Ferritin    Standing Status:   Future    Number of Occurrences:   1    Standing Expiration Date:   03/28/2022    Iron and TIBC    Standing Status:   Future    Number of Occurrences:   1    Standing Expiration Date:   03/28/2022   Antithrombin III    Standing Status:   Future    Number of Occurrences:   1    Standing Expiration Date:   03/28/2022   Protein C activity    Standing Status:   Future    Number of Occurrences:   1    Standing Expiration Date:   03/28/2022   Protein C, total    Standing Status:   Future    Number of Occurrences:   1    Standing Expiration Date:   03/28/2022   Protein S activity    Standing Status:   Future    Number of Occurrences:   1    Standing Expiration Date:   03/28/2022   Protein S, total    Standing Status:   Future    Number of Occurrences:   1    Standing Expiration Date:   03/28/2022   Lupus anticoagulant panel    Standing Status:   Future    Number of Occurrences:   1    Standing Expiration Date:   03/28/2022   Beta-2-glycoprotein i abs, IgG/M/A    Standing Status:   Future    Number of Occurrences:   1    Standing Expiration Date:   03/28/2022   Factor 5 leiden    Standing Status:   Future    Number of Occurrences:   1    Standing Expiration Date:   03/28/2022   Prothrombin gene mutation    Standing Status:   Future    Number of Occurrences:   1    Standing Expiration Date:   03/28/2022   Cardiolipin antibodies, IgG, IgM, IgA    Standing Status:   Future    Number of Occurrences:   1    Standing Expiration Date:   03/28/2022   Homocysteine, serum    Standing Status:   Future    Number of Occurrences:   1    Standing Expiration Date:   03/28/2022   Hexagonal Phospholipid Neutralization    Standing Status:   Future    Standing Expiration Date:   03/28/2022    All of  the patients questions were answered with apparent satisfaction. The patient knows to call the clinic with any problems, questions or concerns.  I spent 40 mins counseling the patient face to face. The total time spent in the appointment was 50 mins  and more than 50% was on counseling  and direct patient cares.    Sullivan Lone MD Arma AAHIVMS [facility] Hematology/Oncology Physician [facility]  (Office):       [phone] (Work cell):  [phone] (Fax):           [phone]  03/28/2021 12:55 PM

## 2021-03-29 ENCOUNTER — Telehealth: Payer: Self-pay | Admitting: Hematology

## 2021-03-29 DIAGNOSIS — Z1211 Encounter for screening for malignant neoplasm of colon: Secondary | ICD-10-CM | POA: Diagnosis not present

## 2021-03-29 LAB — HOMOCYSTEINE: Homocysteine: 9.3 umol/L (ref 0.0–14.5)

## 2021-03-29 NOTE — Telephone Encounter (Signed)
Left message with follow-up appointment per 12/5 los.

## 2021-03-30 LAB — CARDIOLIPIN ANTIBODIES, IGG, IGM, IGA
Anticardiolipin IgA: <9 APL U/mL (ref 0–11)
Anticardiolipin IgG: <9 GPL U/mL (ref 0–14)
Anticardiolipin IgM: 51 MPL U/mL — ABNORMAL HIGH (ref 0–12)

## 2021-03-31 LAB — PROTEIN S, TOTAL: Protein S Ag, Total: 103 % (ref 60–150)

## 2021-03-31 LAB — FECAL OCCULT BLOOD, IMMUNOCHEMICAL: IFOBT: NEGATIVE

## 2021-03-31 LAB — LUPUS ANTICOAGULANT PANEL
DRVVT: 49.8 s — ABNORMAL HIGH (ref 0.0–47.0)
PTT Lupus Anticoagulant: 34.7 s (ref 0.0–51.9)

## 2021-03-31 LAB — PROTEIN S ACTIVITY: Protein S Activity: 90 % (ref 63–140)

## 2021-03-31 LAB — DRVVT MIX: dRVVT Mix: 43.3 s — ABNORMAL HIGH (ref 0.0–40.4)

## 2021-03-31 LAB — BETA-2-GLYCOPROTEIN I ABS, IGG/M/A
Beta-2 Glyco I IgG: <9 GPI IgG units (ref 0–20)
Beta-2-Glycoprotein I IgA: <9 GPI IgA units (ref 0–25)
Beta-2-Glycoprotein I IgM: <9 GPI IgM units (ref 0–32)

## 2021-03-31 LAB — DRVVT CONFIRM: dRVVT Confirm: 1.2 ratio (ref 0.8–1.2)

## 2021-03-31 LAB — PROTEIN C ACTIVITY: Protein C Activity: 157 % (ref 73–180)

## 2021-04-01 LAB — PROTEIN C, TOTAL: Protein C, Total: 123 % (ref 60–150)

## 2021-04-04 LAB — PROTHROMBIN GENE MUTATION

## 2021-04-04 LAB — FACTOR 5 LEIDEN

## 2021-04-07 ENCOUNTER — Inpatient Hospital Stay (HOSPITAL_BASED_OUTPATIENT_CLINIC_OR_DEPARTMENT_OTHER): Payer: 59 | Admitting: Hematology

## 2021-04-07 DIAGNOSIS — I824Y3 Acute embolism and thrombosis of unspecified deep veins of proximal lower extremity, bilateral: Secondary | ICD-10-CM

## 2021-04-07 DIAGNOSIS — I2699 Other pulmonary embolism without acute cor pulmonale: Secondary | ICD-10-CM | POA: Diagnosis not present

## 2021-04-07 DIAGNOSIS — R760 Raised antibody titer: Secondary | ICD-10-CM

## 2021-04-08 ENCOUNTER — Telehealth: Payer: Self-pay | Admitting: Hematology

## 2021-04-08 NOTE — Telephone Encounter (Signed)
Left message with follow-up appointments per 12/15 los. °

## 2021-04-25 NOTE — Progress Notes (Signed)
Marland Kitchen   HEMATOLOGY/ONCOLOGY PHONE VISIT NOTE  Date of Service: .04/07/2021   Patient Care Team: Paula Compton, MD as PCP - General (Obstetrics and Gynecology)  CHIEF COMPLAINTS/PURPOSE OF CONSULTATION:  Phone visit to discuss hypercoagulable work-up done for her recent DVT and PE  HISTORY OF PRESENTING ILLNESS:   April Herring is a wonderful 51 y.o. female who has been referred to [place] by Ray Church, MD  for evaluation and management of DVT and acute PE  Patient is a overall healthy individual with no significant chronic medical comorbidities with a history of COVID-19 infection in January 2022. She notes she had significant perimenopausal symptoms and was started on Loestrin about 3-4 months prior to her presentation at [facility] 03/02/2021 with chest tightness, cough subjective fevers, hypertension tachycardia and tachypnea without hypoxia. Patient thought she might have a flulike illness.  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 Patient had an echocardiogram that showed normal right ventricular systolic function size and pressures. Lower extremity ultrasounds 03/03/2021 showed acute DVT involving the right popliteal vein, right peroneal veins and right gastrocnemius veins.  Findings consistent with acute DVT involving the left peroneal veins.  Patient was treated with IV heparin and transition to Eliquis prior to discharge. Patient notes improvement and resolution of her shortness of breath and fever-like symptoms.  No current chest pain.  Her DVTs and PE was noted to be likely provoked in the context of perimenopausal hormonal exposure but given the extent of the DVT and PE she is here to discuss whether additional work-up is necessary.  Patient is quite physically active and works as a  Marine scientist.  Recent long distance travel or immobility. No history of heavy steroid use. No family history of thrombophilia, DVT or PE. No previous history of VTE Patient notes she was on hormonal birth control pills in her 74s without any previous history of VTE. Patient denies being a smoker. No significant issues with varicose veins or chronic venous insufficiency. Patient notes no other new focal symptoms in the last 3 to 6 months suggestive of malignancy.  She is up-to-date on cervical and breast cancer screening and is due for her first colonoscopy.  She notes she has follow-up with OB/GYN to discuss alternative options to address her perimenopausal symptoms.  INTERVAL HISTORY  .I connected with April Herring on 04/25/21 at  1:40 PM EST by telephone visit and verified that I am speaking with the correct person using two identifiers.   I discussed the limitations, risks, security and privacy concerns of performing an evaluation and management service by telemedicine and the availability of in-person appointments. I also discussed with the patient that there may be a patient responsible charge related to this service. The patient expressed understanding and agreed to proceed.   Other persons participating in the visit and their role in the encounter: None  Patients location: Home Providers location: [place]  Chief Complaint: Discussion of labs done for hypercoagulability work-up for her DVT PE.  Patient was called to discuss her lab results.  She notes no acute new symptoms.  She notes improvement in her bilateral lower extremity discomfort and swelling.  No new chest pain or shortness of breath.  Breathing is improving. We discussed her hypercoagulability labs in detail which are unremarkable except she has significantly elevated anticardiolipin  IgM antibodies if persistent could be concerning for antiphospholipid antibody syndrome.   MEDICAL HISTORY:  Past Medical  History:  Diagnosis Date   COVID-19    Pulmonary embolus [facility])     SURGICAL HISTORY: Past Surgical History:  Procedure Laterality Date   BREAST BIOPSY Left 06/13/2013   benign    SOCIAL HISTORY: Social History   Socioeconomic History   Marital status: Married    Spouse name: Not on file   Number of children: Not on file   Years of education: Not on file   Highest education level: Not on file  Occupational History   Not on file  Tobacco Use   Smoking status: Never   Smokeless tobacco: Never  Substance and Sexual Activity   Alcohol use: Yes    Comment: social   Drug use: Never   Sexual activity: Not on file  Other Topics Concern   Not on file  Social History Narrative   Not on file   Social Determinants of Health   Financial Resource Strain: Not on file  Food Insecurity: Not on file  Transportation Needs: Not on file  Physical Activity: Not on file  Stress: Not on file  Social Connections: Not on file  Intimate Partner Violence: Not on file    FAMILY HISTORY: Family History  Problem Relation Age of Onset   Breast cancer Maternal Aunt    Breast cancer Maternal Grandmother     ALLERGIES:  has No Known Allergies.  MEDICATIONS:  Current Outpatient Medications  Medication Sig Dispense Refill   apixaban (ELIQUIS) 5 MG TABS tablet Take 1 tablet (5 mg total) by mouth 2 (two) times daily. 60 tablet 4   COVID-19 mRNA bivalent vaccine, Pfizer, (PFIZER COVID-19 VAC BIVALENT) injection Inject into the muscle. 0.3 mL 0   pantoprazole (PROTONIX) 40 MG tablet TAKE 1 TABLET BY MOUTH ONCE DAILY. (Patient taking differently: Take 40 mg by mouth daily as needed (heart burn).) 90 tablet 5   Triamcinolone Acetonide (NASACORT ALLERGY 24HR NA) Place 1 spray into both nostrils daily as needed (congestion).     No current facility-administered medications for this visit.    REVIEW OF SYSTEMS:    .10 Point review of Systems was done is negative except as noted  above.   PHYSICAL EXAMINATION: Telemedicine visit  LABORATORY DATA:  I have reviewed the data as listed  . CBC Latest Ref Rng & Units 03/28/2021 03/04/2021 03/03/2021  WBC 4.0 - 10.5 K/uL 6.6 5.9 5.6  Hemoglobin 12.0 - 15.0 g/dL 13.0 12.2 12.7  Hematocrit 36.0 - 46.0 % 39.6 37.6 38.1  Platelets 150 - 400 K/uL 226 260 237    . CMP Latest Ref Rng & Units 03/28/2021 03/04/2021 03/02/2021  Glucose 70 - 99 mg/dL 89 94 138(H)  BUN 6 - 20 mg/dL 16 8 10   Creatinine 0.44 - 1.00 mg/dL 0.73 0.70 0.72  Sodium 135 - 145 mmol/L 140 139 136  Potassium 3.5 - 5.1 mmol/L 3.9 3.6 3.2(L)  Chloride 98 - 111 mmol/L 107 108 105  CO2 22 - 32 mmol/L 23 22 22   Calcium 8.9 - 10.3 mg/dL 8.8(L) 8.4(L) 8.5(L)  Total Protein 6.5 - 8.1 g/dL 7.4 - -  Total Bilirubin 0.3 - 1.2 mg/dL 0.5 - -  Alkaline Phos 38 - 126 U/L 88 - -  AST 15 - 41 U/L 25 - -  ALT 0 - 44 U/L 48(H) - -   Component     Latest Ref Rng & Units 03/28/2021  WBC     4.0 - 10.5 K/uL 6.6  RBC     3.87 - 5.11 MIL/uL 4.31  Hemoglobin     12.0 - 15.0 g/dL 13.0  HCT     36.0 - 46.0 % 39.6  MCV     80.0 - 100.0 fL 91.9  MCH     26.0 - 34.0 pg 30.2  MCHC     30.0 - 36.0 g/dL 32.8  RDW     11.5 - 15.5 % 12.7  Platelets     150 - 400 K/uL 226  nRBC     0.0 - 0.2 % 0.0  Neutrophils     % 64  NEUT#     1.7 - 7.7 K/uL 4.2  Lymphocytes     % 26  Lymphocyte #     0.7 - 4.0 K/uL 1.7  Monocytes Relative     % 6  Monocyte #     0.1 - 1.0 K/uL 0.4  Eosinophil     % 3  Eosinophils Absolute     0.0 - 0.5 K/uL 0.2  Basophil     % 1  Basophils Absolute     0.0 - 0.1 K/uL 0.0  Immature Granulocytes     % 0  Abs Immature Granulocytes     0.00 - 0.07 K/uL 0.01  Sodium     135 - 145 mmol/L 140  Potassium     3.5 - 5.1 mmol/L 3.9  Chloride     98 - 111 mmol/L 107  CO2     22 - 32 mmol/L 23  Glucose     70 - 99 mg/dL 89  BUN     6 - 20 mg/dL 16  Creatinine     0.44 - 1.00 mg/dL 0.73  Calcium     8.9 - 10.3 mg/dL 8.8 (L)   Total Protein     6.5 - 8.1 g/dL 7.4  Albumin     3.5 - 5.0 g/dL 4.0  AST     15 - 41 U/L 25  ALT     0 - 44 U/L 48 (H)  Alkaline Phosphatase     38 - 126 U/L 88  Total Bilirubin     0.3 - 1.2 mg/dL 0.5  GFR, Est Non African American     >60 mL/min >60  Anion gap     5 - 15 10  Iron     41 - 142 ug/dL 74  TIBC     236 - 444 ug/dL 428  Saturation Ratios     21 - 57 % 17 (L)  UIBC     120 - 384 ug/dL 354  Anticardiolipin Ab,IgG,Qn     0 - 14 GPL U/mL <9  Anticardiolipin Ab,IgM,Qn     0 - 12 MPL U/mL 51 (H)  Anticardiolipin Ab,IgA,Qn     0 - 11 APL U/mL <9  Beta-2 Glycoprotein I Ab, IgG     0 - 20 GPI IgG units <9  Beta-2-Glycoprotein I IgM     0 - 32 GPI IgM units <9  Beta-2-Glycoprotein I IgA     0 - 25 GPI IgA units <9  PTT Lupus Anticoagulant     0.0 - 51.9 sec 34.7  DRVVT     0.0 - 47.0 sec 49.8 (H)  Lupus Anticoag Interp      Comment:  Homocysteine     0.0 - 14.5 umol/L 9.3  Recommendations-PTGENE:  Comment  Recommendations-F5LEID:      Comment  Protein S, Total     60 - 150 % 103  Protein S-Functional     63 - 140 % 90  Protein C, Total     60 - 150 % 123  Protein C-Functional     73 - 180 % 157  Antithrombin Activity     75 - 120 % 116  Ferritin     11 - 307 ng/mL 43  dRVVT Mix     0.0 - 40.4 sec 43.3 (H)  dRVVT Confirm     0.8 - 1.2 ratio 1.2   Prothrombin gene mutation negative Factor V Leiden mutation negative Lupus anticoagulant negative  RADIOGRAPHIC STUDIES: I have personally reviewed the radiological images as listed and agreed with the findings in the report. No results found.  ASSESSMENT & PLAN:   51 year old in good overall health with  1) Submassive bilateral PEs with concern for right ventricular strain and 2) bilateral lower extremity DVTs In November 2022. 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  Her shortness of  breath is also improving. -Her hypercoagulability work-up was discussed in detail.  This was unrevealing except for having significantly elevated titer of anticardiolipin IgM antibody at 51. -We discussed that these antibody titers are fairly elevated and if this antibody is persistent 12 weeks apart could represent the presence of possible acquired antiphospholipid antibody syndrome in the context of extensive bilateral lower extremity DVT and PE as a risk factor in addition to her estrogen-containing hormonal exposure as a provoking risk factor. -She is tolerating her Eliquis and we will continue this for anticoagulation at this time. -She was previously also recommended to not use any systemic hormonal treatments. -We will repeat labs to recheck her anticardiolipin antibodies in 12 weeks and if still persistent she might need long-term anticoagulation. -If her antiphospholipid antibodies are resolved we would probably complete anticoagulation for 6 to 9 months and then transition her to a baby aspirin for an additional 1 to 2 years in the context of extensive VTE. -I again reiterated general methodologies to reduce risk of VTE including maintaining good hydration, precautions with long distance travel and staying active, use of compression socks etc..  Follow-up Labs in 11 weeks Phone visit with Dr Irene Limbo in 12 weeks  No orders of the defined types were placed in this encounter.   All of the patients questions were answered with apparent satisfaction. The patient knows to call the clinic with any problems, questions or concerns. . The total time spent in the appointment was 20 minutes discussing all of hypercoagulability work-up the significance of anticardiolipin antibody and will need for repeat testing follow-up.     Sullivan Lone MD Brenda AAHIVMS [facility] Hematology/Oncology Physician [facility]

## 2021-04-29 ENCOUNTER — Other Ambulatory Visit (HOSPITAL_BASED_OUTPATIENT_CLINIC_OR_DEPARTMENT_OTHER): Payer: Self-pay

## 2021-05-30 ENCOUNTER — Other Ambulatory Visit (HOSPITAL_BASED_OUTPATIENT_CLINIC_OR_DEPARTMENT_OTHER): Payer: Self-pay

## 2021-06-03 ENCOUNTER — Other Ambulatory Visit: Payer: Self-pay

## 2021-06-03 ENCOUNTER — Encounter: Payer: Self-pay | Admitting: Emergency Medicine

## 2021-06-03 ENCOUNTER — Emergency Department
Admission: EM | Admit: 2021-06-03 | Discharge: 2021-06-03 | Disposition: A | Discharge status: Home / Self Care | Payer: 59 | Source: Home / Self Care

## 2021-06-03 ENCOUNTER — Other Ambulatory Visit (HOSPITAL_BASED_OUTPATIENT_CLINIC_OR_DEPARTMENT_OTHER): Payer: Self-pay

## 2021-06-03 DIAGNOSIS — J0100 Acute maxillary sinusitis, unspecified: Secondary | ICD-10-CM

## 2021-06-03 DIAGNOSIS — R059 Cough, unspecified: Secondary | ICD-10-CM | POA: Diagnosis not present

## 2021-06-03 DIAGNOSIS — J309 Allergic rhinitis, unspecified: Secondary | ICD-10-CM

## 2021-06-03 MED ORDER — AMOXICILLIN-POT CLAVULANATE 875-125 MG PO TABS
1.0000 | ORAL_TABLET | Freq: Two times a day (BID) | ORAL | 0 refills | Status: AC
  Filled 2021-06-03: qty 20, 10d supply, fill #0

## 2021-06-03 MED ORDER — BENZONATATE 200 MG PO CAPS
200.0000 mg | ORAL_CAPSULE | Freq: Three times a day (TID) | ORAL | 0 refills | Status: AC | PRN
  Filled 2021-06-03: qty 40, 14d supply, fill #0

## 2021-06-03 MED ORDER — FEXOFENADINE HCL 180 MG PO TABS
180.0000 mg | ORAL_TABLET | Freq: Every day | ORAL | 0 refills | Status: DC
  Filled 2021-06-03: qty 30, 30d supply, fill #0

## 2021-06-03 NOTE — ED Triage Notes (Signed)
Pt c/o cough with mucous x1 week. Chest and head feels congested. Taking OTC cold meds

## 2021-06-03 NOTE — ED Provider Notes (Signed)
April Herring CARE    CSN: [id number] Arrival date & time: 06/03/21  [id number]      History   Chief Complaint Chief Complaint  Patient presents with   Cough    HPI April Herring is a 51 y.o. female.   HPI 51 year old female presents with cough, mucus, chest, and head congestion for 1 week.  Reports taking OTC cold medicines with little to no relief.  PMH significant for PE and is currently on Apixaban and denies any unusual bleeding  Past Medical History:  Diagnosis Date   COVID-19    Pulmonary embolus [facility])     Patient Active Problem List   Diagnosis Date Noted   PE (pulmonary thromboembolism) ([place]) 03/02/2021    Past Surgical History:  Procedure Laterality Date   BREAST BIOPSY Left 06/13/2013   benign    OB History   No obstetric history on file.      Home Medications    Prior to Admission medications   Medication Sig Start Date End Date Taking? Authorizing Provider  amoxicillin-clavulanate (AUGMENTIN) 875-125 MG tablet Take 1 tablet by mouth every 12 (twelve) hours for 10 days. 06/03/21 06/13/21 Yes Eliezer Lofts, FNP  benzonatate (TESSALON) 200 MG capsule Take 1 capsule (200 mg total) by mouth 3 (three) times daily as needed for up to 7 days for cough. 06/03/21 06/17/21 Yes Eliezer Lofts, FNP  fexofenadine [facility] ALLERGY) 180 MG tablet Take 1 tablet (180 mg total) by mouth daily for 15 days. 06/03/21 07/03/21 Yes Eliezer Lofts, FNP  apixaban (ELIQUIS) 5 MG TABS tablet Take 1 tablet (5 mg total) by mouth 2 (two) times daily. 03/28/21   Brunetta Genera, MD  apixaban (ELIQUIS) 5 MG TABS tablet Eliquis 5 mg tablet  Take 1 tablet twice a day by oral route.    [provider]  COVID-19 mRNA bivalent vaccine, Pfizer, (PFIZER COVID-19 VAC BIVALENT) injection Inject into the muscle. 02/08/21   Carlyle Basques, MD  pantoprazole (PROTONIX) 40 MG tablet TAKE 1 TABLET BY MOUTH ONCE DAILY. Patient taking differently: Take 40 mg by mouth daily as needed  (heart burn). 12/09/19 03/28/21  Bovard-Stuckert, Jeral Fruit, MD  Triamcinolone Acetonide (NASACORT ALLERGY 24HR NA) Place 1 spray into both nostrils daily as needed (congestion).    [provider]    Family History Family History  Problem Relation Age of Onset   Breast cancer Maternal Aunt    Breast cancer Maternal Grandmother     Social History Social History   Tobacco Use   Smoking status: Never   Smokeless tobacco: Never  Vaping Use   Vaping Use: Never used  Substance Use Topics   Alcohol use: Yes    Comment: social   Drug use: Never     Allergies   Patient has no known allergies.   Review of Systems Review of Systems  HENT:  Positive for congestion, postnasal drip and sinus pressure.   Respiratory:  Positive for cough.   All other systems reviewed and are negative.   Physical Exam Triage Vital Signs ED Triage Vitals  Enc Vitals Group     BP 06/03/21 0944 (!) 137/91     Pulse Rate 06/03/21 0944 99     Resp 06/03/21 0944 20     Temp 06/03/21 0944 98.2 F (36.8 C)     Temp Source 06/03/21 0944 Oral     SpO2 06/03/21 0944 97 %     Weight --      Height --  Head Circumference --      Peak Flow --      Pain Score 06/03/21 0947 0     Pain Loc --      Pain Edu? --      Excl. in [place]? --    No data found.  Updated Vital Signs BP (!) 137/91 (BP Location: Right Arm)    Pulse 99    Temp 98.2 F (36.8 C) (Oral)    Resp 20    SpO2 97%    Physical Exam Vitals and nursing note reviewed.  Constitutional:      General: She is not in acute distress.    Appearance: She is obese. She is not ill-appearing.  HENT:     Head: Normocephalic and atraumatic.     Right Ear: Tympanic membrane and external ear normal.     Left Ear: Tympanic membrane and external ear normal.     Ears:     Comments: Moderate eustachian tube dysfunction noted bilaterally    Nose: Nose normal.     Mouth/Throat:     Mouth: Mucous membranes are moist.     Pharynx: Oropharynx is  clear.     Comments: Moderate amount of clear drainage of posterior oropharynx noted Eyes:     Extraocular Movements: Extraocular movements intact.     Conjunctiva/sclera: Conjunctivae normal.     Pupils: Pupils are equal, round, and reactive to light.  Cardiovascular:     Rate and Rhythm: Normal rate and regular rhythm.     Pulses: Normal pulses.     Heart sounds: Normal heart sounds.  Pulmonary:     Effort: Pulmonary effort is normal.     Breath sounds: Normal breath sounds.  Musculoskeletal:     Cervical back: Normal range of motion and neck supple.  Skin:    General: Skin is warm and dry.  Neurological:     Mental Status: She is alert.     UC Treatments / Results  Labs (all labs ordered are listed, but only abnormal results are displayed) Labs Reviewed - No data to display  EKG   Radiology No results found.  Procedures Procedures (including critical care time)  Medications Ordered in UC Medications - No data to display  Initial Impression / Assessment and Plan / UC Course  I have reviewed the triage vital signs and the nursing notes.  Pertinent labs & imaging results that were available during my care of the patient were reviewed by me and considered in my medical decision making (see chart for details).     MDM: 1.  Acute maxillary sinusitis, recurrence not specified-Rx'd Augmentin; 2.  Cough-Rx'd Tessalon Perles; 3.  Allergic rhinitis-Rx'd Allegra. Advised patient to take medication as directed with food to completion.  Advised patient to take Allegra with first dose of Augmentin for the next 5 of 10 days.  Advised may use Allegra afterwards as needed for concurrent postnasal drainage/drip.  Advised patient may use Tessalon Perles daily or as needed for cough.  Encouraged patient to increase daily water intake while taking these medications.  Patient discharged home, hemodynamically stable. Final Clinical Impressions(s) / UC Diagnoses   Final diagnoses:  Cough,  unspecified type  Acute maxillary sinusitis, recurrence not specified  Allergic rhinitis, unspecified seasonality, unspecified trigger     Discharge Instructions      Advised patient to take medication as directed with food to completion.  Advised patient to take Allegra with first dose of Augmentin for the next 5 of  10 days.  Advised may use Allegra afterwards as needed for concurrent postnasal drainage/drip.  Advised patient may use Tessalon Perles daily or as needed for cough.  Encouraged patient to increase daily water intake while taking these medications.     ED Prescriptions     Medication Sig Dispense Auth. Provider   amoxicillin-clavulanate (AUGMENTIN) 875-125 MG tablet Take 1 tablet by mouth every 12 (twelve) hours for 10 days. 20 tablet Eliezer Lofts, FNP   fexofenadine [facility] ALLERGY) 180 MG tablet Take 1 tablet (180 mg total) by mouth daily for 15 days. 30 tablet Eliezer Lofts, FNP   benzonatate (TESSALON) 200 MG capsule Take 1 capsule (200 mg total) by mouth 3 (three) times daily as needed for up to 7 days for cough. 40 capsule Eliezer Lofts, FNP      PDMP not reviewed this encounter.   Eliezer Lofts, [place] 06/03/21 1030

## 2021-06-03 NOTE — Discharge Instructions (Addendum)
Advised patient to take medication as directed with food to completion.  Advised patient to take Allegra with first dose of Augmentin for the next 5 of 10 days.  Advised may use Allegra afterwards as needed for concurrent postnasal drainage/drip.  Advised patient may use Tessalon Perles daily or as needed for cough.  Encouraged patient to increase daily water intake while taking these medications.

## 2021-06-14 ENCOUNTER — Other Ambulatory Visit (HOSPITAL_BASED_OUTPATIENT_CLINIC_OR_DEPARTMENT_OTHER): Payer: Self-pay

## 2021-06-15 ENCOUNTER — Other Ambulatory Visit (HOSPITAL_BASED_OUTPATIENT_CLINIC_OR_DEPARTMENT_OTHER): Payer: Self-pay

## 2021-06-16 ENCOUNTER — Other Ambulatory Visit (HOSPITAL_BASED_OUTPATIENT_CLINIC_OR_DEPARTMENT_OTHER): Payer: Self-pay

## 2021-06-16 MED ORDER — PANTOPRAZOLE SODIUM 40 MG PO TBEC
DELAYED_RELEASE_TABLET | ORAL | 3 refills | Status: AC
Start: 2021-06-16 — End: ?
  Filled 2021-06-16: qty 90, 90d supply, fill #0
  Filled 2021-09-13: qty 90, 90d supply, fill #1
  Filled 2021-10-26 – 2021-11-28 (×3): qty 90, 90d supply, fill #2
  Filled 2022-05-25: qty 90, 90d supply, fill #3

## 2021-06-23 ENCOUNTER — Other Ambulatory Visit: Payer: Self-pay

## 2021-06-23 ENCOUNTER — Inpatient Hospital Stay: Payer: 59 | Attending: Hematology

## 2021-06-23 DIAGNOSIS — I824Y3 Acute embolism and thrombosis of unspecified deep veins of proximal lower extremity, bilateral: Secondary | ICD-10-CM

## 2021-06-23 DIAGNOSIS — R760 Raised antibody titer: Secondary | ICD-10-CM

## 2021-06-23 DIAGNOSIS — I2699 Other pulmonary embolism without acute cor pulmonale: Secondary | ICD-10-CM | POA: Diagnosis not present

## 2021-06-23 DIAGNOSIS — D6859 Other primary thrombophilia: Secondary | ICD-10-CM

## 2021-06-23 LAB — CMP (CANCER CENTER ONLY)
ALT: 27 U/L (ref 0–44)
AST: 18 U/L (ref 15–41)
Albumin: 4.3 g/dL (ref 3.5–5.0)
Alkaline Phosphatase: 71 U/L (ref 38–126)
Anion gap: 7 (ref 5–15)
BUN: 17 mg/dL (ref 6–20)
CO2: 25 mmol/L (ref 22–32)
Calcium: 9.3 mg/dL (ref 8.9–10.3)
Chloride: 105 mmol/L (ref 98–111)
Creatinine: 0.83 mg/dL (ref 0.44–1.00)
GFR, Estimated: >60 mL/min (ref >60)
Glucose, Bld: 131 mg/dL — ABNORMAL HIGH (ref 70–99)
Potassium: 3.8 mmol/L (ref 3.5–5.1)
Sodium: 137 mmol/L (ref 135–145)
Total Bilirubin: 0.6 mg/dL (ref 0.3–1.2)
Total Protein: 7.2 g/dL (ref 6.5–8.1)

## 2021-06-23 LAB — CBC WITH DIFFERENTIAL/PLATELET
Abs Immature Granulocytes: 0.01 10*3/uL (ref 0.00–0.07)
Basophils Absolute: 0 10*3/uL (ref 0.0–0.1)
Basophils Relative: 1 %
Eosinophils Absolute: 0.1 10*3/uL (ref 0.0–0.5)
Eosinophils Relative: 1 %
HCT: 41.1 % (ref 36.0–46.0)
Hemoglobin: 13.6 g/dL (ref 12.0–15.0)
Immature Granulocytes: 0 %
Lymphocytes Relative: 19 %
Lymphs Abs: 1.6 10*3/uL (ref 0.7–4.0)
MCH: 31.1 pg (ref 26.0–34.0)
MCHC: 33.1 g/dL (ref 30.0–36.0)
MCV: 93.8 fL (ref 80.0–100.0)
Monocytes Absolute: 0.4 10*3/uL (ref 0.1–1.0)
Monocytes Relative: 5 %
Neutro Abs: 6.1 10*3/uL (ref 1.7–7.7)
Neutrophils Relative %: 74 %
Platelets: 232 10*3/uL (ref 150–400)
RBC: 4.38 MIL/uL (ref 3.87–5.11)
RDW: 13 % (ref 11.5–15.5)
WBC: 8.3 10*3/uL (ref 4.0–10.5)
nRBC: 0 % (ref 0.0–0.2)

## 2021-06-24 LAB — CARDIOLIPIN ANTIBODIES, IGG, IGM, IGA
Anticardiolipin IgA: <9 APL U/mL (ref 0–11)
Anticardiolipin IgG: <9 GPL U/mL (ref 0–14)
Anticardiolipin IgM: <9 MPL U/mL (ref 0–12)

## 2021-06-27 ENCOUNTER — Other Ambulatory Visit (HOSPITAL_BASED_OUTPATIENT_CLINIC_OR_DEPARTMENT_OTHER): Payer: Self-pay

## 2021-06-29 LAB — HEXAGONAL PHOSPHOLIPID NEUTRALIZATION: Hexagonal Phospholipid Neutral: 7 s

## 2021-06-30 ENCOUNTER — Other Ambulatory Visit: Payer: Self-pay

## 2021-06-30 ENCOUNTER — Inpatient Hospital Stay (HOSPITAL_BASED_OUTPATIENT_CLINIC_OR_DEPARTMENT_OTHER): Payer: 59 | Admitting: Hematology

## 2021-06-30 DIAGNOSIS — R760 Raised antibody titer: Secondary | ICD-10-CM

## 2021-06-30 DIAGNOSIS — I2699 Other pulmonary embolism without acute cor pulmonale: Secondary | ICD-10-CM | POA: Diagnosis not present

## 2021-06-30 DIAGNOSIS — I824Y3 Acute embolism and thrombosis of unspecified deep veins of proximal lower extremity, bilateral: Secondary | ICD-10-CM

## 2021-06-30 NOTE — Progress Notes (Unsigned)
Sinusitis No bleeding No leg swelling  No limited. Breathing back

## 2021-07-01 ENCOUNTER — Telehealth: Payer: Self-pay | Admitting: Hematology

## 2021-07-01 NOTE — Telephone Encounter (Signed)
Left message with follow-up appointments per 3/9 los. ?

## 2021-07-05 ENCOUNTER — Other Ambulatory Visit (HOSPITAL_BASED_OUTPATIENT_CLINIC_OR_DEPARTMENT_OTHER): Payer: Self-pay

## 2021-07-14 ENCOUNTER — Other Ambulatory Visit: Payer: Self-pay | Admitting: Obstetrics and Gynecology

## 2021-07-14 DIAGNOSIS — N644 Mastodynia: Secondary | ICD-10-CM

## 2021-07-15 DIAGNOSIS — Z1231 Encounter for screening mammogram for malignant neoplasm of breast: Secondary | ICD-10-CM

## 2021-08-05 ENCOUNTER — Other Ambulatory Visit (HOSPITAL_BASED_OUTPATIENT_CLINIC_OR_DEPARTMENT_OTHER): Payer: Self-pay

## 2021-08-12 ENCOUNTER — Ambulatory Visit
Admission: RE | Admit: 2021-08-12 | Discharge: 2021-08-12 | Disposition: A | Discharge status: Home / Self Care | Payer: 59 | Source: Ambulatory Visit | Attending: Obstetrics and Gynecology | Admitting: Obstetrics and Gynecology

## 2021-08-12 DIAGNOSIS — N644 Mastodynia: Secondary | ICD-10-CM

## 2021-08-12 DIAGNOSIS — R922 Inconclusive mammogram: Secondary | ICD-10-CM | POA: Diagnosis not present

## 2021-08-12 LAB — HM MAMMOGRAPHY

## 2021-09-13 ENCOUNTER — Other Ambulatory Visit (HOSPITAL_BASED_OUTPATIENT_CLINIC_OR_DEPARTMENT_OTHER): Payer: Self-pay

## 2021-09-13 ENCOUNTER — Other Ambulatory Visit: Payer: Self-pay | Admitting: Hematology

## 2021-09-13 MED ORDER — APIXABAN 5 MG PO TABS
5.0000 mg | ORAL_TABLET | Freq: Two times a day (BID) | ORAL | 4 refills | Status: DC
Start: 2021-09-13 — End: 2022-05-03
  Filled 2021-09-13: qty 60, 30d supply, fill #0
  Filled 2021-10-26: qty 60, 30d supply, fill #1
  Filled 2021-11-21: qty 60, 30d supply, fill #2

## 2021-10-26 ENCOUNTER — Telehealth: Payer: Self-pay | Admitting: Hematology

## 2021-10-26 ENCOUNTER — Other Ambulatory Visit (HOSPITAL_COMMUNITY): Payer: Self-pay

## 2021-10-26 NOTE — Telephone Encounter (Signed)
Left message with rescheduled upcoming appointment due to provider's schedule. 

## 2021-11-21 ENCOUNTER — Other Ambulatory Visit (HOSPITAL_BASED_OUTPATIENT_CLINIC_OR_DEPARTMENT_OTHER): Payer: Self-pay

## 2021-11-28 ENCOUNTER — Other Ambulatory Visit (HOSPITAL_BASED_OUTPATIENT_CLINIC_OR_DEPARTMENT_OTHER): Payer: Self-pay

## 2021-11-29 ENCOUNTER — Inpatient Hospital Stay: Payer: 59 | Attending: Hematology

## 2021-11-29 ENCOUNTER — Other Ambulatory Visit: Payer: Self-pay

## 2021-11-29 DIAGNOSIS — Z86711 Personal history of pulmonary embolism: Secondary | ICD-10-CM | POA: Diagnosis not present

## 2021-11-29 DIAGNOSIS — I2699 Other pulmonary embolism without acute cor pulmonale: Secondary | ICD-10-CM

## 2021-11-29 DIAGNOSIS — Z7901 Long term (current) use of anticoagulants: Secondary | ICD-10-CM | POA: Diagnosis not present

## 2021-11-29 DIAGNOSIS — Z86718 Personal history of other venous thrombosis and embolism: Secondary | ICD-10-CM | POA: Diagnosis not present

## 2021-11-29 LAB — CMP (CANCER CENTER ONLY)
ALT: 26 U/L (ref 0–44)
AST: 18 U/L (ref 15–41)
Albumin: 4.3 g/dL (ref 3.5–5.0)
Alkaline Phosphatase: 62 U/L (ref 38–126)
Anion gap: 4 — ABNORMAL LOW (ref 5–15)
BUN: 16 mg/dL (ref 6–20)
CO2: 28 mmol/L (ref 22–32)
Calcium: 9.1 mg/dL (ref 8.9–10.3)
Chloride: 105 mmol/L (ref 98–111)
Creatinine: 0.68 mg/dL (ref 0.44–1.00)
GFR, Estimated: >60 mL/min (ref >60)
Glucose, Bld: 105 mg/dL — ABNORMAL HIGH (ref 70–99)
Potassium: 3.9 mmol/L (ref 3.5–5.1)
Sodium: 137 mmol/L (ref 135–145)
Total Bilirubin: 0.7 mg/dL (ref 0.3–1.2)
Total Protein: 7.4 g/dL (ref 6.5–8.1)

## 2021-11-29 LAB — CBC WITH DIFFERENTIAL (CANCER CENTER ONLY)
Abs Immature Granulocytes: 0.02 10*3/uL (ref 0.00–0.07)
Basophils Absolute: 0.1 10*3/uL (ref 0.0–0.1)
Basophils Relative: 1 %
Eosinophils Absolute: 0.1 10*3/uL (ref 0.0–0.5)
Eosinophils Relative: 1 %
HCT: 41.1 % (ref 36.0–46.0)
Hemoglobin: 13.7 g/dL (ref 12.0–15.0)
Immature Granulocytes: 0 %
Lymphocytes Relative: 18 %
Lymphs Abs: 1.5 10*3/uL (ref 0.7–4.0)
MCH: 30.9 pg (ref 26.0–34.0)
MCHC: 33.3 g/dL (ref 30.0–36.0)
MCV: 92.8 fL (ref 80.0–100.0)
Monocytes Absolute: 0.5 10*3/uL (ref 0.1–1.0)
Monocytes Relative: 6 %
Neutro Abs: 6.3 10*3/uL (ref 1.7–7.7)
Neutrophils Relative %: 74 %
Platelet Count: 247 10*3/uL (ref 150–400)
RBC: 4.43 MIL/uL (ref 3.87–5.11)
RDW: 13.7 % (ref 11.5–15.5)
WBC Count: 8.6 10*3/uL (ref 4.0–10.5)
nRBC: 0 % (ref 0.0–0.2)

## 2021-12-01 LAB — CARDIOLIPIN ANTIBODIES, IGG, IGM, IGA
Anticardiolipin IgA: <9 APL U/mL (ref 0–11)
Anticardiolipin IgG: <9 GPL U/mL (ref 0–14)
Anticardiolipin IgM: <9 MPL U/mL (ref 0–12)

## 2021-12-02 ENCOUNTER — Inpatient Hospital Stay (HOSPITAL_BASED_OUTPATIENT_CLINIC_OR_DEPARTMENT_OTHER): Payer: 59 | Admitting: Hematology

## 2021-12-02 DIAGNOSIS — I824Y3 Acute embolism and thrombosis of unspecified deep veins of proximal lower extremity, bilateral: Secondary | ICD-10-CM

## 2021-12-02 DIAGNOSIS — I2699 Other pulmonary embolism without acute cor pulmonale: Secondary | ICD-10-CM

## 2021-12-02 LAB — HEXAGONAL PHOSPHOLIPID NEUTRALIZATION: Hexagonal Phospholipid Neutral: 6 s

## 2021-12-06 ENCOUNTER — Other Ambulatory Visit (HOSPITAL_BASED_OUTPATIENT_CLINIC_OR_DEPARTMENT_OTHER): Payer: Self-pay

## 2021-12-08 NOTE — Progress Notes (Signed)
Marland Kitchen   HEMATOLOGY/ONCOLOGY PHONE VISIT NOTE  Date of Service: .12/02/2021   Patient Care Team: Huel Cote, MD as PCP - General (Obstetrics and Gynecology)  CHIEF COMPLAINTS/PURPOSE OF CONSULTATION:  Phone visit for follow-up of her DVT/PE and repeat labs  HISTORY OF PRESENTING ILLNESS:   April Herring is a wonderful 51 y.o. female who has been referred to [place] by Alda Lea, MD  for evaluation and management of DVT and acute PE  Patient is a overall healthy individual with no significant chronic medical comorbidities with a history of COVID-19 infection in January 2022. She notes she had significant perimenopausal symptoms and was started on Loestrin about 3-4 months prior to her presentation at [facility] 03/02/2021 with chest tightness, cough subjective fevers, hypertension tachycardia and tachypnea without hypoxia. Patient thought she might have a flulike illness.  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 Patient had an echocardiogram that showed normal right ventricular systolic function size and pressures. Lower extremity ultrasounds 03/03/2021 showed acute DVT involving the right popliteal vein, right peroneal veins and right gastrocnemius veins.  Findings consistent with acute DVT involving the left peroneal veins.  Patient was treated with IV heparin and transition to Eliquis prior to discharge. Patient notes improvement and resolution of her shortness of breath and fever-like symptoms.  No current chest pain.  Her DVTs and PE was noted to be likely provoked in the context of perimenopausal hormonal exposure but given the extent of the DVT and PE she is here to discuss whether additional work-up is necessary.  Patient is quite physically active and works as a Engineer, civil (consulting).  Recent long  distance travel or immobility. No history of heavy steroid use. No family history of thrombophilia, DVT or PE. No previous history of VTE Patient notes she was on hormonal birth control pills in her 101s without any previous history of VTE. Patient denies being a smoker. No significant issues with varicose veins or chronic venous insufficiency. Patient notes no other new focal symptoms in the last 3 to 6 months suggestive of malignancy.  She is up-to-date on cervical and breast cancer screening and is due for her first colonoscopy.  She notes she has follow-up with OB/GYN to discuss alternative options to address her perimenopausal symptoms.  INTERVAL HISTORY  .I connected with Charlynne Pander on 08/11/2023at  3:30 PM EDT by telephone visit and verified that I am speaking with the correct person using two identifiers.   I discussed the limitations, risks, security and privacy concerns of performing an evaluation and management service by telemedicine and the availability of in-person appointments. I also discussed with the patient that there may be a patient responsible charge related to this service. The patient expressed understanding and agreed to proceed.   Other persons participating in the visit and their role in the encounter: None  Patient's location: At work Provider's location: [facility] Complaint: Follow-up for DVT and PE.  Patient notes no acute new concerns over the last 5 months.  No new leg swelling.  No new chest pain or shortness of breath.  No issues with bleeding on her anticoagulation.  She has now completed nearly 9 months of anticoagulation with resolution of initial symptoms. Labs from 11/29/2021 were discussed with her in detail and showed no new concerns.  Anticardiolipin antibodies were  negative.  Hexagonal phospholipid neutralization assay negative.    MEDICAL HISTORY:  Past Medical History:  Diagnosis Date   COVID-19    Pulmonary embolus  [facility])     SURGICAL HISTORY: Past Surgical History:  Procedure Laterality Date   BREAST BIOPSY Left 06/13/2013   benign    SOCIAL HISTORY: Social History   Socioeconomic History   Marital status: Married    Spouse name: Not on file   Number of children: Not on file   Years of education: Not on file   Highest education level: Not on file  Occupational History   Not on file  Tobacco Use   Smoking status: Never   Smokeless tobacco: Never  Vaping Use   Vaping Use: Never used  Substance and Sexual Activity   Alcohol use: Yes    Comment: social   Drug use: Never   Sexual activity: Not on file  Other Topics Concern   Not on file  Social History Narrative   Not on file   Social Determinants of Health   Financial Resource Strain: Not on file  Food Insecurity: Not on file  Transportation Needs: Not on file  Physical Activity: Not on file  Stress: Not on file  Social Connections: Not on file  Intimate Partner Violence: Not on file    FAMILY HISTORY: Family History  Problem Relation Age of Onset   Breast cancer Maternal Aunt    Breast cancer Maternal Grandmother     ALLERGIES:  has No Known Allergies.  MEDICATIONS:  Current Outpatient Medications  Medication Sig Dispense Refill   apixaban (ELIQUIS) 5 MG TABS tablet Eliquis 5 mg tablet  Take 1 tablet twice a day by oral route.     apixaban (ELIQUIS) 5 MG TABS tablet Take 1 tablet (5 mg total) by mouth 2 (two) times daily. 60 tablet 4   COVID-19 mRNA bivalent vaccine, Pfizer, (PFIZER COVID-19 VAC BIVALENT) injection Inject into the muscle. 0.3 mL 0   fexofenadine (ALLEGRA ALLERGY) 180 MG tablet Take 1 tablet (180 mg total) by mouth daily for 15 days. 30 tablet 0   pantoprazole (PROTONIX) 40 MG tablet TAKE 1 TABLET BY MOUTH ONCE DAILY. (Patient taking differently: Take 40 mg by mouth daily as needed (heart burn).) 90 tablet 5   pantoprazole (PROTONIX) 40 MG tablet Take 1 tablet(s) every day by oral route. 90 tablet 3    Triamcinolone Acetonide (NASACORT ALLERGY 24HR NA) Place 1 spray into both nostrils daily as needed (congestion).     No current facility-administered medications for this visit.    REVIEW OF SYSTEMS:   10 Point review of Systems was done is negative except as noted above.  PHYSICAL EXAMINATION Telemedicine visit  LABORATORY DATA:  I have reviewed the data as listed  .    Latest Ref Rng & Units 11/29/2021   11:38 AM 06/23/2021    1:33 PM 03/28/2021    2:24 PM  CBC  WBC 4.0 - 10.5 K/uL 8.6  8.3  6.6   Hemoglobin 12.0 - 15.0 g/dL 40.0  86.7  61.9   Hematocrit 36.0 - 46.0 % 41.1  41.1  39.6   Platelets 150 - 400 K/uL 247  232  226     .    Latest Ref Rng & Units 11/29/2021   11:38 AM 06/23/2021    1:33 PM 03/28/2021    2:24 PM  CMP  Glucose 70 - 99 mg/dL 509  326  89   BUN 6 - 20  mg/dL 16  17  16    Creatinine 0.44 - 1.00 mg/dL  6.01  0.93   Sodium 135 - 145 mmol/L 137  137  140   Potassium 3.5 - 5.1 mmol/L 3.9  3.8  3.9   Chloride 98 - 111 mmol/L 105  105  107   CO2 22 - 32 mmol/L 28  25  23    Calcium 8.9 - 10.3 mg/dL 9.1  9.3  8.8   Total Protein 6.5 - 8.1 g/dL 7.4  7.2  7.4   Total Bilirubin 0.3 - 1.2 mg/dL 0.7  0.6  0.5   Alkaline Phos 38 - 126 U/L 62  71  88   AST 15 - 41 U/L 18  18  25    ALT 0 - 44 U/L 26  27  48       Component     Latest Ref Rng & Units 03/28/2021  WBC     4.0 - 10.5 K/uL 6.6  RBC     3.87 - 5.11 MIL/uL 4.31  Hemoglobin     12.0 - 15.0 g/dL  HCT     - 14/08/2020 % 39.6  MCV     80.0 - 100.0 fL 91.9  MCH     26.0 - 34.0 pg 30.2  MCHC     30.0 - 36.0 g/dL 57.3  RDW     22.0 - 25.4 % 12.7  Platelets     150 - 400 K/uL 226  nRBC     0.0 - 0.2 % 0.0  Neutrophils     % 64  NEUT#     1.7 - 7.7 K/uL 4.2  Lymphocytes     % 26  Lymphocyte #     0.7 - 4.0 K/uL 1.7  Monocytes Relative     % 6  Monocyte #     0.1 - 1.0 K/uL 0.4  Eosinophil     % 3  Eosinophils Absolute     0.0 - 0.5 K/uL 0.2  Basophil     % 1   Basophils Absolute     0.0 - 0.1 K/uL 0.0  Immature Granulocytes     % 0  Abs Immature Granulocytes     0.00 - 0.07 K/uL 0.01  Sodium     135 - 145 mmol/L 140  Potassium     3.5 - 5.1 mmol/L 3.9  Chloride     98 - 111 mmol/L 107  CO2     22 - 32 mmol/L 23  Glucose     70 - 99 mg/dL 89  BUN     6 - 20 mg/dL 16  Creatinine     27.0 - 1.00 mg/dL 62.3  Calcium     8.9 - 10.3 mg/dL 8.8 (L)  Total Protein     6.5 - 8.1 g/dL 7.4  Albumin     3.5 - 5.0 g/dL 4.0  AST     15 - 41 U/L 25  ALT     0 - 44 U/L 48 (H)  Alkaline Phosphatase     38 - 126 U/L 88  Total Bilirubin     0.3 - 1.2 mg/dL 0.5  GFR, Est Non African American     >60 mL/min >60  Anion gap     5 - 15 10  Iron     41 - 142 ug/dL 74  TIBC     76.2 - 8.31 ug/dL 5.17  Saturation Ratios  21 - 57 % 17 (L)  UIBC     120 - 384 ug/dL 709  Anticardiolipin Ab,IgG,Qn     0 - 14 GPL U/mL <9  Anticardiolipin Ab,IgM,Qn     0 - 12 MPL U/mL 51 (H)  Anticardiolipin Ab,IgA,Qn     0 - 11 APL U/mL <9  Beta-2 Glycoprotein I Ab, IgG     0 - 20 GPI IgG units <9  Beta-2-Glycoprotein I IgM     0 - 32 GPI IgM units <9  Beta-2-Glycoprotein I IgA     0 - 25 GPI IgA units <9  PTT Lupus Anticoagulant     0.0 - 51.9 sec 34.7  DRVVT     0.0 - 47.0 sec 49.8 (H)  Lupus Anticoag Interp      Comment:  Homocysteine     0.0 - 14.5 umol/L 9.3  Recommendations-PTGENE:      Comment  Recommendations-F5LEID:      Comment  Protein S, Total     60 - 150 % 103  Protein S-Functional     63 - 140 % 90  Protein C, Total     60 - 150 % 123  Protein C-Functional     73 - 180 % 157  Antithrombin Activity     75 - 120 % 116  Ferritin     11 - 307 ng/mL 43  dRVVT Mix     0.0 - 40.4 sec 43.3 (H)  dRVVT Confirm     0.8 - 1.2 ratio 1.2   Prothrombin gene mutation negative Factor V Leiden mutation negative Lupus anticoagulant negative  RADIOGRAPHIC STUDIES: I have personally reviewed the radiological images as listed and  agreed with the findings in the report. No results found.  ASSESSMENT & PLAN:   51 year old in good overall health with  1) Submassive bilateral PEs with concern for right ventricular strain and 2) bilateral lower extremity DVTs In November 2022.  Thought to be provoked by her estrogen-containing birth control pills. 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  Repeat labs showed negative anticardiolipin IgM antibody PLAN -Patient was called to discuss her lab results from 11/29/2021 and follow-up on her management for VTE. He has-been taking Eliquis without any bleeding issues and notes resolution of her lower extremity swelling and pain as well as no new symptoms regarding this.  No new chest pain or shortness of breath. -Repeat anticardiolipin antibodies and Hexagonal phospholipid neutralization testing negative. -Patient was recommended to avoid any form of estrogen containing hormonal therapies due to risk of recurrent VTE. -The absence of any other findings of inherited or other acquired thrombophilia patient will complete her current prescription of Eliquis to finish 9 months of anticoagulation and then can transition to baby aspirin for 2 additional years. -Discussed strategies to continue to reduce the risk of blood clots including staying well-hydrated discussed appropriate precautions with travel and surgery. -Patient will continue to follow-up with her primary care physician and we shall see her back as needed.  Follow-up Return to clinic with Dr. Candise Che as needed Follow-up with PCP No orders of the defined types were placed in this encounter.  The total time spent in the appointment was 11 minutes*.  All of the patient's questions were answered with apparent satisfaction. The patient knows to call the clinic with any problems, questions or concerns.   Wyvonnia Lora MD MS AAHIVMS [facility] Hematology/Oncology Physician [facility]  .*Total Encounter  Time as defined by  the Centers for Medicare and Medicaid Services includes, in addition to the face-to-face time of a patient visit (documented in the note above) non-face-to-face time: obtaining and reviewing outside history, ordering and reviewing medications, tests or procedures, care coordination (communications with other health care professionals or caregivers) and documentation in the medical record.

## 2021-12-12 ENCOUNTER — Other Ambulatory Visit (HOSPITAL_BASED_OUTPATIENT_CLINIC_OR_DEPARTMENT_OTHER): Payer: Self-pay

## 2021-12-12 MED ORDER — PAROXETINE HCL 10 MG PO TABS
ORAL_TABLET | ORAL | 1 refills | Status: DC
  Filled 2021-12-12: qty 90, 90d supply, fill #0

## 2021-12-21 ENCOUNTER — Other Ambulatory Visit (HOSPITAL_BASED_OUTPATIENT_CLINIC_OR_DEPARTMENT_OTHER): Payer: Self-pay

## 2022-03-22 DIAGNOSIS — Z131 Encounter for screening for diabetes mellitus: Secondary | ICD-10-CM | POA: Diagnosis not present

## 2022-03-22 DIAGNOSIS — Z0000 Encounter for general adult medical examination without abnormal findings: Secondary | ICD-10-CM | POA: Diagnosis not present

## 2022-03-22 DIAGNOSIS — N926 Irregular menstruation, unspecified: Secondary | ICD-10-CM | POA: Diagnosis not present

## 2022-03-22 DIAGNOSIS — Z13220 Encounter for screening for lipoid disorders: Secondary | ICD-10-CM | POA: Diagnosis not present

## 2022-03-22 DIAGNOSIS — Z713 Dietary counseling and surveillance: Secondary | ICD-10-CM | POA: Diagnosis not present

## 2022-03-22 DIAGNOSIS — Z1329 Encounter for screening for other suspected endocrine disorder: Secondary | ICD-10-CM | POA: Diagnosis not present

## 2022-03-22 DIAGNOSIS — Z6831 Body mass index (BMI) 31.0-31.9, adult: Secondary | ICD-10-CM | POA: Diagnosis not present

## 2022-03-23 ENCOUNTER — Other Ambulatory Visit (HOSPITAL_BASED_OUTPATIENT_CLINIC_OR_DEPARTMENT_OTHER): Payer: Self-pay

## 2022-03-23 MED ORDER — WEGOVY 1 MG/0.5ML ~~LOC~~ SOAJ
1.0000 mg | SUBCUTANEOUS | 0 refills | Status: DC
  Filled 2022-03-23 – 2022-03-27 (×2): qty 2, 28d supply, fill #0

## 2022-03-24 ENCOUNTER — Other Ambulatory Visit (HOSPITAL_BASED_OUTPATIENT_CLINIC_OR_DEPARTMENT_OTHER): Payer: Self-pay

## 2022-03-27 ENCOUNTER — Other Ambulatory Visit (HOSPITAL_COMMUNITY): Payer: Self-pay

## 2022-03-27 ENCOUNTER — Other Ambulatory Visit (HOSPITAL_BASED_OUTPATIENT_CLINIC_OR_DEPARTMENT_OTHER): Payer: Self-pay

## 2022-03-27 MED ORDER — CLONAZEPAM 0.5 MG PO TABS
0.5000 mg | ORAL_TABLET | ORAL | 0 refills | Status: DC
  Filled 2022-06-06: qty 15, fill #0
  Filled 2022-08-04: qty 15, 15d supply, fill #0

## 2022-03-27 MED ORDER — CLONAZEPAM 0.5 MG PO TABS
0.2500 mg | ORAL_TABLET | Freq: Every evening | ORAL | 0 refills | Status: DC | PRN
  Filled 2022-03-27: qty 15, 15d supply, fill #0

## 2022-05-03 ENCOUNTER — Encounter: Payer: Self-pay | Admitting: Family Medicine

## 2022-05-03 ENCOUNTER — Ambulatory Visit (INDEPENDENT_AMBULATORY_CARE_PROVIDER_SITE_OTHER): Payer: 59 | Admitting: Family Medicine

## 2022-05-03 VITALS — BP 141/90 | HR 94 | Temp 98.8°F | Resp 16 | Ht 67.0 in | Wt 191.6 lb

## 2022-05-03 DIAGNOSIS — E669 Obesity, unspecified: Secondary | ICD-10-CM

## 2022-05-03 DIAGNOSIS — K219 Gastro-esophageal reflux disease without esophagitis: Secondary | ICD-10-CM

## 2022-05-03 DIAGNOSIS — Z7689 Persons encountering health services in other specified circumstances: Secondary | ICD-10-CM

## 2022-05-03 DIAGNOSIS — Z6830 Body mass index (BMI) 30.0-30.9, adult: Secondary | ICD-10-CM | POA: Diagnosis not present

## 2022-05-03 NOTE — Patient Instructions (Signed)
Thank you for choosing [facility] for your Primary Care needs. I am excited for the opportunity to partner with you to meet your health care goals. It was a pleasure meeting you today!  Information on diet, exercise, and health maintenance recommendations are listed below. This is information to help you be sure you are on track for optimal health and monitoring.   Please look over this and let us know if you have any questions or if you have completed any of the health maintenance outside of [facility] so that we can be sure your records are up to date.  ___________________________________________________________  MyChart:  For all urgent or time sensitive needs we ask that you please call the office to avoid delays. Our number is [phone]. MyChart is not constantly monitored and due to the large volume of messages a day, replies may take up to 72 business hours.  MyChart Policy: MyChart allows for you to see your visit notes, after visit summary, provider recommendations, lab and tests results, make an appointment, request refills, and contact your provider or the office for non-urgent questions or concerns. Providers are seeing patients during normal business hours and do not have built in time to review MyChart messages.  We ask that you allow a minimum of 3 business days for responses to MyChart messages. For this reason, please do not send urgent requests through MyChart. Please call the office at [phone]. New and ongoing conditions may require a visit. We have virtual and in-person visits available for your convenience.  Complex MyChart concerns may require a visit. Your provider may request you schedule a virtual or in-person visit to ensure we are providing the best care possible. MyChart messages sent after 11:00 AM on Friday will not be received by the provider until Monday morning.    Lab and Test Results: You will receive your lab and test  results on MyChart as soon as they are completed and results have been sent by the lab or testing facility. Due to this service, you will receive your results BEFORE your provider.  I review lab and test results each morning prior to seeing patients. Some results require collaboration with other providers to ensure you are receiving the most appropriate care. For this reason, we ask that you please allow a minimum of 3-5 business days from the time that ALL results have been received for your provider to receive and review lab and test results and contact you about these.  Most lab and test result comments from the provider will be sent through MyChart. Your provider may recommend changes to the plan of care, follow-up visits, repeat testing, ask questions, or request an office visit to discuss these results. You may reply directly to this message or call the office to provide information for the provider or set up an appointment. In some instances, you will be called with test results and recommendations. Please let us know if this is preferred and we will make note of this in your chart to provide this for you.    If you have not heard a response to your lab or test results in 5 business days from all results returning to MyChart, please call the office to let us know. We ask that you please avoid calling prior to this time unless there is an emergent concern. Due to high call volumes, this can delay the resulting process.  After Hours: For all non-emergency after hours needs, please   call the office at [phone] and select the option to reach the on-call  service. On-call services are shared between multiple [place] offices and therefore it will not be possible to speak directly with your provider. On-call providers may provide medical advice and recommendations, but are unable to provide refills for maintenance medications.  For all emergency or urgent medical needs after normal business hours, we  recommend that you seek care at the closest Urgent Care or Emergency Department to ensure appropriate treatment in a timely manner.  [facility] has a 24 hour emergency room located on the ground floor for your convenience.   Urgent Concerns During the Business Day Providers are seeing patients from 8AM to 5PM with a busy schedule and are most often not able to respond to non-urgent calls until the end of the day or the next business day. If you should have URGENT concerns during the day, please call and speak to the nurse or schedule a same day appointment so that we can address your concern without delay.   Thank you, again, for choosing me as your health care partner. I appreciate your trust and look forward to learning more about you!   Jalexa Pifer B. Gautam Langhorst, DNP, FNP-C  ___________________________________________________________  Health Maintenance Recommendations Screening Testing Mammogram Every 1-2 years based on history and risk factors Starting at age 50 Pap Smear Ages 21-39 every 3 years Ages 30-65 every 5 years with HPV testing More frequent testing may be required based on results and history Colon Cancer Screening Every 1-10 years based on test performed, risk factors, and history Starting at age 45 Bone Density Screening Every 2-10 years based on history Starting at age 65 for women Recommendations for men differ based on medication usage, history, and risk factors AAA Screening One time ultrasound Men 65-75 years old who have ever smoked Lung Cancer Screening Low Dose Lung CT every 12 months Age 50-80 years with a 20 pack-year smoking history who still smoke or who have quit within the last 15 years  Screening Labs Routine  Labs: Complete Blood Count (CBC), Complete Metabolic Panel (CMP), Cholesterol (Lipid Panel) Every 6-12 months based on history and medications May be recommended more frequently based on current conditions or previous results Hemoglobin  A1c Lab Every 3-12 months based on history and previous results Starting at age 45 or earlier with diagnosis of diabetes, high cholesterol, BMI >26, and/or risk factors Frequent monitoring for patients with diabetes to ensure blood sugar control Thyroid Panel  Every 6 months based on history, symptoms, and risk factors May be repeated more often if on medication HIV One time testing for all patients 13 and older May be repeated more frequently for patients with increased risk factors or exposure Hepatitis C One time testing for all patients 18 and older May be repeated more frequently for patients with increased risk factors or exposure Gonorrhea, Chlamydia Every 12 months for all sexually active persons 13-24 years Additional monitoring may be recommended for those who are considered high risk or who have symptoms PSA Men 40-54 years old with risk factors Additional screening may be recommended from age 55-69 based on risk factors, symptoms, and history  Vaccine Recommendations Tetanus Booster All adults every 10 years Flu Vaccine All patients 6 months and older every year COVID Vaccine All patients 12 years and older Initial dosing with booster May recommend additional booster based on age and health history HPV Vaccine 2 doses all patients age 9-26 Dosing may be considered   for patients over 26 Shingles Vaccine (Shingrix) 2 doses all adults 50 years and older Pneumonia (Pneumovax 23) All adults 65 years and older May recommend earlier dosing based on health history Pneumonia (Prevnar 13) All adults 65 years and older Dosed 1 year after Pneumovax 23 Pneumonia (Prevnar 20) All adults 65 years and older (adults 19-64 with certain conditions or risk factors) 1 dose  For those who have not received Prevnar 13 vaccine previously   Additional Screening, Testing, and Vaccinations may be recommended on an individualized basis based on family history, health history, risk  factors, and/or exposure.  __________________________________________________________  Diet Recommendations for All Patients  I recommend that all patients maintain a diet low in saturated fats, carbohydrates, and cholesterol. While this can be challenging at first, it is not impossible and small changes can make big differences.  Things to try: Decreasing the amount of soda, sweet tea, and/or juice to one or less per day and replace with water While water is always the first choice, if you do not like water you may consider adding a water additive without sugar to improve the taste other sugar free drinks Replace potatoes with a brightly colored vegetable  Use healthy oils, such as canola oil or olive oil, instead of butter or hard margarine Limit your bread intake to two pieces or less a day Replace regular pasta with low carb pasta options Bake, broil, or grill foods instead of frying Monitor portion sizes  Eat smaller, more frequent meals throughout the day instead of large meals  An important thing to remember is, if you love foods that are not great for your health, you don't have to give them up completely. Instead, allow these foods to be a reward when you have done well. Allowing yourself to still have special treats every once in a while is a nice way to tell yourself thank you for working hard to keep yourself healthy.   Also remember that every day is a new day. If you have a bad day and "fall off the wagon", you can still climb right back up and keep moving along on your journey!  We have resources available to help you!  Some websites that may be helpful include: www.MyPlate.gov  Www.VeryWellFit.com _____________________________________________________________  Activity Recommendations for All Patients  I recommend that all adults get at least 20 minutes of moderate physical activity that elevates your heart rate at least 5 days out of the week.  Some examples  include: Walking or jogging at a pace that allows you to carry on a conversation Cycling (stationary bike or outdoors) Water aerobics Yoga Weight lifting Dancing If physical limitations prevent you from putting stress on your joints, exercise in a pool or seated in a chair are excellent options.  Do determine your MAXIMUM heart rate for activity: 220 - YOUR AGE = MAX Heart Rate   Remember! Do not push yourself too hard.  Start slowly and build up your pace, speed, weight, time in exercise, etc.  Allow your body to rest between exercise and get good sleep. You will need more water than normal when you are exerting yourself. Do not wait until you are thirsty to drink. Drink with a purpose of getting in at least 8, 8 ounce glasses of water a day plus more depending on how much you exercise and sweat.    If you begin to develop dizziness, chest pain, abdominal pain, jaw pain, shortness of breath, headache, vision changes, lightheadedness, or other concerning symptoms,   stop the activity and allow your body to rest. If your symptoms are severe, seek emergency evaluation immediately. If your symptoms are concerning, but not severe, please let us know so that we can recommend further evaluation.     

## 2022-05-03 NOTE — Progress Notes (Signed)
New Patient Office Visit  Subjective    Patient ID: KYLEN ISMAEL, female    DOB: 11-26-1970  Age: 52 y.o. MRN: [id number]  CC: establish care   HPI April Herring presents to establish care. 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 She was previously getting all of her care from her OBGYN.   History of PE (causing anxiety/insomnia): Patient reports she had a PE in November 2022 and was hospitalized, getting heparin. She was discharged on Eliquis which she took for 8 months. Now she is only on 81 mg aspiring. She follows with hematology (Dr. Irene Limbo) annually. Reports unknown etiology for the PE. States that in the months following, she struggled with anxiety and insomnia and was started on PRN klonopin when she uses rarely now, but does find it effective.   Weight management: Patient reports she had been trying hard to lose weight with lifestyle measures. She and her husband tried Keto for a 8 months, but she didn't have much success. She started [facility] last month and is currently on her second week of 0.5 mg/week dose. States she has tolerated well with no side effects. GYN has been prescribing this for her, but she will have [place] take over management. She still trying to eat a healthy/balanced diet and stay physically active. States she has been told her cholesterol was slightly high in the past.   GERD: She reports her reflux is well controlled with Protonix and lifestyle measures. No acute concerns today.   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   Alopecia: Follows with dermatology. No recent problems. Taking Biotin.  HM: Reports her pap, mammo, and cologuard were all normal last year - requesting records from [facility].      Outpatient Encounter Medications as of 05/03/2022  Medication Sig   ACYCLOVIR PO Take by mouth.   Biotin 10000 MCG TABS Take 10,000 mcg by mouth.   clonazePAM (KLONOPIN) 0.5 MG tablet Take 1 tablet as needed by oral route  for 30 days.   pantoprazole (PROTONIX) 40 MG tablet Take 1 tablet(s) every day by oral route.   Semaglutide-Weight Management (WEGOVY) 1 MG/0.5ML SOAJ Inject 1 mg into the skin once a week.   [DISCONTINUED] apixaban (ELIQUIS) 5 MG TABS tablet Eliquis 5 mg tablet  Take 1 tablet twice a day by oral route.   [DISCONTINUED] apixaban (ELIQUIS) 5 MG TABS tablet Take 1 tablet (5 mg total) by mouth 2 (two) times daily.   [DISCONTINUED] clonazePAM (KLONOPIN) 0.5 MG tablet Take 0.5-1 tablets (0.25-0.5 mg total) by mouth at bedtime as needed for sleep. Max daily dose: 1 tablet.   [DISCONTINUED] COVID-19 mRNA bivalent vaccine, Pfizer, (PFIZER COVID-19 VAC BIVALENT) injection Inject into the muscle.   [DISCONTINUED] fexofenadine (ALLEGRA ALLERGY) 180 MG tablet Take 1 tablet (180 mg total) by mouth daily for 15 days.   [DISCONTINUED] pantoprazole (PROTONIX) 40 MG tablet TAKE 1 TABLET BY MOUTH ONCE DAILY. (Patient taking differently: Take 40 mg by mouth daily as needed (heart burn).)   [DISCONTINUED] PARoxetine (PAXIL) 10 MG tablet Take 1 tablet by mouth every day.   [DISCONTINUED] Triamcinolone Acetonide (NASACORT ALLERGY 24HR NA) Place 1 spray into both nostrils daily as needed (congestion).   No facility-administered encounter medications on file as of 05/03/2022.    Past Medical History:  Diagnosis Date   Alopecia    COVID-19    GERD (gastroesophageal reflux disease)    HSV-2 infection    Pulmonary embolus ([place])  Past Surgical History:  Procedure Laterality Date   BREAST BIOPSY Left 06/13/2013   benign    Family History  Problem Relation Age of Onset   Breast cancer Maternal Aunt    Breast cancer Maternal Grandmother     Social History   Socioeconomic History   Marital status: Married    Spouse name: Not on file   Number of children: Not on file   Years of education: Not on file   Highest education level: Not on file  Occupational History   Not on file  Tobacco Use   Smoking  status: Never   Smokeless tobacco: Never  Vaping Use   Vaping Use: Never used  Substance and Sexual Activity   Alcohol use: Yes    Comment: social   Drug use: Never   Sexual activity: Not on file  Other Topics Concern   Not on file  Social History Narrative   Not on file   Social Determinants of Health   Financial Resource Strain: Not on file  Food Insecurity: Not on file  Transportation Needs: Not on file  Physical Activity: Not on file  Stress: Not on file  Social Connections: Not on file  Intimate Partner Violence: Not on file    ROS All review of systems negative except what is listed in the HPI      Objective    BP (!) 141/90   Pulse 94   Temp 98.8 F (37.1 C)   Resp 16   Ht 5\' 7"  (1.702 m)   Wt 191 lb 9.6 oz (86.9 kg)   SpO2 98%   BMI 30.01 kg/m   Physical Exam Vitals reviewed.  Constitutional:      Appearance: Normal appearance.  Cardiovascular:     Rate and Rhythm: Normal rate and regular rhythm.     Pulses: Normal pulses.     Heart sounds: Normal heart sounds.  Pulmonary:     Effort: Pulmonary effort is normal.     Breath sounds: Normal breath sounds.  Musculoskeletal:     Right lower leg: No edema.     Left lower leg: No edema.  Skin:    General: Skin is warm and dry.  Neurological:     Mental Status: She is alert and oriented to person, place, and time.  Psychiatric:        Mood and Affect: Mood normal.        Behavior: Behavior normal.        Thought Content: Thought content normal.        Judgment: Judgment normal.         Assessment & Plan:   Problem List Items Addressed This Visit       Digestive   GERD (gastroesophageal reflux disease) Stable on Protonix and lifestyle measures.    Other Visit Diagnoses     Class 1 obesity without serious comorbidity with body mass index (BMI) of 30.0 to 30.9 in adult, unspecified obesity type    -  Primary Continue Wegovy 0.5 mg/week Discussed healthy lifestyle choices Labs updated  today    Relevant Orders   CBC with Differential/Platelet   Comprehensive metabolic panel   Lipid panel   TSH   Encounter to establish care          No acute concerns. Request records from [facility].   Elevated BP today - monitor at home/work for the next 2 weeks. Focus on lifestyle measures. Follow-up in 2 weeks.     Return  in about 2 weeks (around 05/17/2022) for BP, weight f/u with Ladona Ridgel .   Clayborne Dana, NP

## 2022-05-04 LAB — COMPREHENSIVE METABOLIC PANEL
ALT: 30 U/L (ref 0–35)
AST: 21 U/L (ref 0–37)
Albumin: 4.4 g/dL (ref 3.5–5.2)
Alkaline Phosphatase: 73 U/L (ref 39–117)
BUN: 14 mg/dL (ref 6–23)
CO2: 26 mEq/L (ref 19–32)
Calcium: 9.4 mg/dL (ref 8.4–10.5)
Chloride: 106 mEq/L (ref 96–112)
Creatinine, Ser: 0.71 mg/dL (ref 0.40–1.20)
GFR: 97.44 mL/min (ref >60.00)
Glucose, Bld: 85 mg/dL (ref 70–99)
Potassium: 4.1 mEq/L (ref 3.5–5.1)
Sodium: 141 mEq/L (ref 135–145)
Total Bilirubin: 0.7 mg/dL (ref 0.2–1.2)
Total Protein: 7.1 g/dL (ref 6.0–8.3)

## 2022-05-04 LAB — CBC WITH DIFFERENTIAL/PLATELET
Basophils Absolute: 0.1 10*3/uL (ref 0.0–0.1)
Basophils Relative: 0.9 % (ref 0.0–3.0)
Eosinophils Absolute: 0.1 10*3/uL (ref 0.0–0.7)
Eosinophils Relative: 1.4 % (ref 0.0–5.0)
HCT: 41.9 % (ref 36.0–46.0)
Hemoglobin: 14.2 g/dL (ref 12.0–15.0)
Lymphocytes Relative: 24.1 % (ref 12.0–46.0)
Lymphs Abs: 1.7 10*3/uL (ref 0.7–4.0)
MCHC: 33.9 g/dL (ref 30.0–36.0)
MCV: 93.8 fl (ref 78.0–100.0)
Monocytes Absolute: 0.4 10*3/uL (ref 0.1–1.0)
Monocytes Relative: 5.2 % (ref 3.0–12.0)
Neutro Abs: 4.7 10*3/uL (ref 1.4–7.7)
Neutrophils Relative %: 68.4 % (ref 43.0–77.0)
Platelets: 260 10*3/uL (ref 150.0–400.0)
RBC: 4.47 Mil/uL (ref 3.87–5.11)
RDW: 13.4 % (ref 11.5–15.5)
WBC: 6.9 10*3/uL (ref 4.0–10.5)

## 2022-05-04 LAB — TSH: TSH: 0.67 u[IU]/mL (ref 0.35–5.50)

## 2022-05-04 LAB — LIPID PANEL
Cholesterol: 213 mg/dL — ABNORMAL HIGH (ref 0–200)
HDL: 32 mg/dL — ABNORMAL LOW (ref >39.00)
Total CHOL/HDL Ratio: 7
Triglycerides: 495 mg/dL — ABNORMAL HIGH (ref 0.0–149.0)

## 2022-05-04 LAB — LDL CHOLESTEROL, DIRECT: Direct LDL: 125 mg/dL

## 2022-05-19 ENCOUNTER — Ambulatory Visit (INDEPENDENT_AMBULATORY_CARE_PROVIDER_SITE_OTHER): Payer: 59 | Admitting: Family Medicine

## 2022-05-19 ENCOUNTER — Other Ambulatory Visit (HOSPITAL_BASED_OUTPATIENT_CLINIC_OR_DEPARTMENT_OTHER): Payer: Self-pay

## 2022-05-19 ENCOUNTER — Encounter: Payer: Self-pay | Admitting: Family Medicine

## 2022-05-19 VITALS — BP 136/74 | HR 88 | Temp 97.9°F | Resp 16 | Ht 67.0 in | Wt 187.0 lb

## 2022-05-19 DIAGNOSIS — I10 Essential (primary) hypertension: Secondary | ICD-10-CM

## 2022-05-19 DIAGNOSIS — E669 Obesity, unspecified: Secondary | ICD-10-CM

## 2022-05-19 DIAGNOSIS — Z6830 Body mass index (BMI) 30.0-30.9, adult: Secondary | ICD-10-CM | POA: Diagnosis not present

## 2022-05-19 LAB — LIPID PANEL
Cholesterol: 176 mg/dL (ref 0–200)
HDL: 31.4 mg/dL — ABNORMAL LOW (ref >39.00)
NonHDL: 144.82
Total CHOL/HDL Ratio: 6
Triglycerides: 231 mg/dL — ABNORMAL HIGH (ref 0.0–149.0)
VLDL: 46.2 mg/dL — ABNORMAL HIGH (ref 0.0–40.0)

## 2022-05-19 LAB — LDL CHOLESTEROL, DIRECT: Direct LDL: 103 mg/dL

## 2022-05-19 MED ORDER — WEGOVY 1 MG/0.5ML ~~LOC~~ SOAJ
1.0000 mg | SUBCUTANEOUS | 0 refills | Status: DC
  Filled 2022-05-19: qty 2, 28d supply, fill #0

## 2022-05-19 MED ORDER — HYDROCHLOROTHIAZIDE 12.5 MG PO TABS
12.5000 mg | ORAL_TABLET | Freq: Every day | ORAL | 3 refills | Status: DC
  Filled 2022-05-19: qty 90, 90d supply, fill #0
  Filled 2022-08-04: qty 90, 90d supply, fill #1
  Filled 2022-12-13: qty 90, 90d supply, fill #2

## 2022-05-19 NOTE — Progress Notes (Signed)
Established Patient Office Visit  Subjective   Patient ID: April Herring, female    DOB: 07-10-1970  Age: 52 y.o. MRN: [id number]  Chief Complaint  Patient presents with   Follow-up    BP and weight    HPI  Patient is here for blood pressure and weight check.   Weight management: Patient started [facility] in December and has been tolerating well. GYN was prescribing, but she has asked [place] to take over prescriptions. She is working hard to eat a healthy diet and exercise regularly.  - Medications: Wegovy 0.5 mg /weekly Wt Readings from Last 3 Encounters:  05/19/22 187 lb (84.8 kg)  05/03/22 191 lb 9.6 oz (86.9 kg)  03/28/21 191 lb 4.8 oz (86.8 kg)    Elevated Blood Pressure: - Medications: none - Compliance: n/a - Checking BP at home: yes, 130/80s, highest she saw was 150/100, but has had some 140/90's - Denies any SOB, recurrent headaches, CP, vision changes, LE edema, dizziness, palpitations, or medication side effects. - Diet: Mediterranean  BP Readings from Last 3 Encounters:  05/19/22 136/74  05/03/22 (!) 141/90  06/03/21 (!) 137/91            ROS All review of systems negative except what is listed in the HPI    Objective:     BP 136/74   Pulse 88   Temp 97.9 F (36.6 C)   Resp 16   Ht 5\' 7"  (1.702 m)   Wt 187 lb (84.8 kg)   SpO2 99%   BMI 29.29 kg/m    Physical Exam Vitals reviewed.  Constitutional:      Appearance: Normal appearance.  Cardiovascular:     Rate and Rhythm: Normal rate and regular rhythm.     Pulses: Normal pulses.     Heart sounds: Normal heart sounds.  Pulmonary:     Effort: Pulmonary effort is normal.     Breath sounds: Normal breath sounds.  Skin:    General: Skin is warm and dry.  Neurological:     Mental Status: She is alert and oriented to person, place, and time.  Psychiatric:        Mood and Affect: Mood normal.        Behavior: Behavior normal.        Thought Content: Thought content normal.         Judgment: Judgment normal.      No results found for any visits on 05/19/22.    The 10-year ASCVD risk score (Arnett DK, et al., 2019) is: 4.5%    Assessment & Plan:   Problem List Items Addressed This Visit       Cardiovascular and Mediastinum   Primary hypertension    Blood pressure is almost at goal for age and co-morbidities.   Recommendations: continue monitoring, if having consistent high readings, try 0.5-1 tablet of HCTZ daily. You are very close to goal, and I do believe that your efforts with diet, exercise, and weight loss will help you stay at goal! - BP goal <130/80 - monitor and log blood pressures at home - check around the same time each day in a relaxed setting - Limit salt to <2000 mg/day - Follow DASH eating plan (heart healthy diet) - limit alcohol to 2 standard drinks per day for men and 1 per day for women - avoid tobacco products - get at least 2 hours of regular aerobic exercise weekly Patient aware of signs/symptoms requiring further/urgent evaluation.  Relevant Medications   hydrochlorothiazide (HYDRODIURIL) 12.5 MG tablet     Other   Class 1 obesity without serious comorbidity with body mass index (BMI) of 30.0 to 30.9 in adult - Primary    Increasing Wegovy to 1 mg /week Continue with lifestyle measures Checking fasting lipids today - Lifestyle factors for lowering cholesterol include: Diet therapy - heart-healthy diet rich in fruits, veggies, fiber-rich whole grains, lean meats, chicken, fish (at least twice a week), fat-free or 1% dairy products; foods low in saturated/trans fats, cholesterol, sodium, and sugar. Mediterranean diet has shown to be very heart healthy. Regular exercise - recommend at least 30 minutes a day, 5 times per week Weight management       Relevant Medications   Semaglutide-Weight Management (WEGOVY) 1 MG/0.5ML SOAJ   Other Relevant Orders   Lipid panel    Return in about 6 weeks (around 06/30/2022) for weight  check, bp f/u .    Terrilyn Saver, NP

## 2022-05-19 NOTE — Assessment & Plan Note (Signed)
Blood pressure is almost at goal for age and co-morbidities.   Recommendations: continue monitoring, if having consistent high readings, try 0.5-1 tablet of HCTZ daily. You are very close to goal, and I do believe that your efforts with diet, exercise, and weight loss will help you stay at goal! - BP goal <130/80 - monitor and log blood pressures at home - check around the same time each day in a relaxed setting - Limit salt to <2000 mg/day - Follow DASH eating plan (heart healthy diet) - limit alcohol to 2 standard drinks per day for men and 1 per day for women - avoid tobacco products - get at least 2 hours of regular aerobic exercise weekly Patient aware of signs/symptoms requiring further/urgent evaluation.

## 2022-05-19 NOTE — Assessment & Plan Note (Signed)
Increasing Wegovy to 1 mg /week Continue with lifestyle measures Checking fasting lipids today - Lifestyle factors for lowering cholesterol include: Diet therapy - heart-healthy diet rich in fruits, veggies, fiber-rich whole grains, lean meats, chicken, fish (at least twice a week), fat-free or 1% dairy products; foods low in saturated/trans fats, cholesterol, sodium, and sugar. Mediterranean diet has shown to be very heart healthy. Regular exercise - recommend at least 30 minutes a day, 5 times per week Weight management

## 2022-05-19 NOTE — Patient Instructions (Addendum)
Blood pressure is almost at goal for age and co-morbidities.   Recommendations: continue monitoring, if having consistent high readings, try 0.5-1 tablet of HCTZ daily. You are very close to goal, and I do believe that your efforts with diet, exercise, and weight loss will help you stay at goal! - BP goal <130/80 - monitor and log blood pressures at home - check around the same time each day in a relaxed setting - Limit salt to <2000 mg/day - Follow DASH eating plan (heart healthy diet) - limit alcohol to 2 standard drinks per day for men and 1 per day for women - avoid tobacco products - get at least 2 hours of regular aerobic exercise weekly Patient aware of signs/symptoms requiring further/urgent evaluation.   Increasing Wegovy to 1 mg /week Continue with lifestyle measures Checking fasting lipids today - Lifestyle factors for lowering cholesterol include: Diet therapy - heart-healthy diet rich in fruits, veggies, fiber-rich whole grains, lean meats, chicken, fish (at least twice a week), fat-free or 1% dairy products; foods low in saturated/trans fats, cholesterol, sodium, and sugar. Mediterranean diet has shown to be very heart healthy. Regular exercise - recommend at least 30 minutes a day, 5 times per week Weight management

## 2022-06-06 ENCOUNTER — Other Ambulatory Visit: Payer: Self-pay

## 2022-06-12 ENCOUNTER — Other Ambulatory Visit: Payer: Self-pay | Admitting: Family Medicine

## 2022-06-12 ENCOUNTER — Other Ambulatory Visit (HOSPITAL_BASED_OUTPATIENT_CLINIC_OR_DEPARTMENT_OTHER): Payer: Self-pay

## 2022-06-12 DIAGNOSIS — E669 Obesity, unspecified: Secondary | ICD-10-CM

## 2022-06-12 MED ORDER — WEGOVY 1.7 MG/0.75ML ~~LOC~~ SOAJ
1.7000 mg | SUBCUTANEOUS | 0 refills | Status: DC
  Filled 2022-06-12: qty 3, 28d supply, fill #0

## 2022-06-29 ENCOUNTER — Other Ambulatory Visit: Payer: Self-pay | Admitting: Obstetrics and Gynecology

## 2022-06-29 DIAGNOSIS — Z1231 Encounter for screening mammogram for malignant neoplasm of breast: Secondary | ICD-10-CM

## 2022-07-03 ENCOUNTER — Encounter: Payer: Self-pay | Admitting: Family Medicine

## 2022-07-03 ENCOUNTER — Ambulatory Visit (INDEPENDENT_AMBULATORY_CARE_PROVIDER_SITE_OTHER): Payer: 59 | Admitting: Family Medicine

## 2022-07-03 ENCOUNTER — Other Ambulatory Visit (HOSPITAL_BASED_OUTPATIENT_CLINIC_OR_DEPARTMENT_OTHER): Payer: Self-pay

## 2022-07-03 VITALS — BP 126/82 | HR 62 | Temp 98.2°F | Resp 16 | Ht 67.0 in | Wt 184.2 lb

## 2022-07-03 DIAGNOSIS — Z6830 Body mass index (BMI) 30.0-30.9, adult: Secondary | ICD-10-CM | POA: Diagnosis not present

## 2022-07-03 DIAGNOSIS — I10 Essential (primary) hypertension: Secondary | ICD-10-CM

## 2022-07-03 DIAGNOSIS — E669 Obesity, unspecified: Secondary | ICD-10-CM

## 2022-07-03 MED ORDER — WEGOVY 2.4 MG/0.75ML ~~LOC~~ SOAJ
2.4000 mg | SUBCUTANEOUS | 0 refills | Status: DC
  Filled 2022-07-03: qty 9, 84d supply, fill #0
  Filled 2022-07-05: qty 3, 28d supply, fill #0
  Filled 2022-08-04: qty 3, 28d supply, fill #1
  Filled 2022-09-08 – 2022-09-19 (×2): qty 3, 28d supply, fill #2

## 2022-07-03 NOTE — Progress Notes (Signed)
Established Patient Office Visit  Subjective   Patient ID: April Herring, female    DOB: 12-31-1970  Age: 52 y.o. MRN: [id number]  Chief Complaint  Patient presents with   Follow-up    6 week    HPI  Patient is here for 6-week follow-up on weight management and hypertension.   Weight management: She has been taking Wegovy 1.7 mg/week for the past 3 weeks. She has switched to a Mediterranean diet and has been noticing some good improvement. She is going to the gym at least twice a week. She is ready to bump up to the next dose. No side effects. Her insurance will stop covering weight loss medications beginning 4/1 so she would like a referral to [facility] team to further manage her weight loss.  Wt Readings from Last 3 Encounters:  07/03/22 184 lb 4 oz (83.6 kg)  05/19/22 187 lb (84.8 kg)  05/03/22 191 lb 9.6 oz (86.9 kg)    Hypertension: - Medications: hctz 12.5 mg - Compliance: good - Checking BP at home: yes, regular - Denies any SOB, recurrent headaches, CP, vision changes, LE edema, dizziness, palpitations, or medication side effects. - Diet: Mediterranean  - Exercise: walking, gym 2x/week      ROS All review of systems negative except what is listed in the HPI    Objective:     BP 126/82   Pulse 62   Temp 98.2 F (36.8 C) (Oral)   Resp 16   Ht '5\' 7"'$  (1.702 m)   Wt 184 lb 4 oz (83.6 kg)   BMI 28.86 kg/m    Physical Exam Vitals reviewed.  Constitutional:      Appearance: Normal appearance.  Cardiovascular:     Rate and Rhythm: Normal rate and regular rhythm.     Pulses: Normal pulses.     Heart sounds: Normal heart sounds.  Pulmonary:     Effort: Pulmonary effort is normal.     Breath sounds: Normal breath sounds.  Skin:    General: Skin is warm and dry.  Neurological:     Mental Status: She is alert and oriented to person, place, and time.  Psychiatric:        Mood and Affect: Mood normal.        Behavior: Behavior normal.         Thought Content: Thought content normal.        Judgment: Judgment normal.      No results found for any visits on 07/03/22.    The 10-year ASCVD risk score (Arnett DK, et al., 2019) is: 3.3%    Assessment & Plan:   Problem List Items Addressed This Visit       Cardiovascular and Mediastinum   Primary hypertension    Blood pressure is at goal for age and co-morbidities.   Recommendations: continue HCTZ 12.5 mg daily  - BP goal <130/80 - monitor and log blood pressures at home - check around the same time each day in a relaxed setting - Limit salt to <2000 mg/day - Follow DASH eating plan (heart healthy diet) - limit alcohol to 2 standard drinks per day for men and 1 per day for women - avoid tobacco products - get at least 2 hours of regular aerobic exercise weekly Patient aware of signs/symptoms requiring further/urgent evaluation.        Other   Class 1 obesity without serious comorbidity with body mass index (BMI) of 30.0 to 30.9 in  adult - Primary    Increase Wegovy to 2.4 mg weekly Insurance will no longer cover after 4/1 - referral to healthy weight and wellness team Continue Mediterranean diet, portion control, regular exercise.        Relevant Medications   Semaglutide-Weight Management (WEGOVY) 2.4 MG/0.75ML SOAJ   Other Relevant Orders   Amb Ref to Medical Weight Management    Return in about 6 months (around 01/03/2023) for routine follow-up.    Terrilyn Saver, NP

## 2022-07-03 NOTE — Assessment & Plan Note (Signed)
Increase Wegovy to 2.4 mg weekly Insurance will no longer cover after 4/1 - referral to healthy weight and wellness team Continue Mediterranean diet, portion control, regular exercise.

## 2022-07-03 NOTE — Assessment & Plan Note (Addendum)
Blood pressure is at goal for age and co-morbidities.   Recommendations: continue HCTZ 12.5 mg daily  - BP goal <130/80 - monitor and log blood pressures at home - check around the same time each day in a relaxed setting - Limit salt to <2000 mg/day - Follow DASH eating plan (heart healthy diet) - limit alcohol to 2 standard drinks per day for men and 1 per day for women - avoid tobacco products - get at least 2 hours of regular aerobic exercise weekly Patient aware of signs/symptoms requiring further/urgent evaluation.

## 2022-07-05 ENCOUNTER — Telehealth: Payer: Self-pay

## 2022-07-05 ENCOUNTER — Other Ambulatory Visit (HOSPITAL_BASED_OUTPATIENT_CLINIC_OR_DEPARTMENT_OTHER): Payer: Self-pay

## 2022-07-05 NOTE — Telephone Encounter (Signed)
PA initiated via Covermymeds; KEY: [id number]. Unable to complete.   [employer] is unable to respond with clinical questions. Please see more information at the bottom of the page for next steps.  This drug/product is not covered under the pharmacy benefit. Prior Authorization is not available.

## 2022-07-06 ENCOUNTER — Other Ambulatory Visit (HOSPITAL_BASED_OUTPATIENT_CLINIC_OR_DEPARTMENT_OTHER): Payer: Self-pay

## 2022-07-07 ENCOUNTER — Other Ambulatory Visit (HOSPITAL_BASED_OUTPATIENT_CLINIC_OR_DEPARTMENT_OTHER): Payer: Self-pay

## 2022-07-10 ENCOUNTER — Other Ambulatory Visit (HOSPITAL_BASED_OUTPATIENT_CLINIC_OR_DEPARTMENT_OTHER): Payer: Self-pay

## 2022-07-11 ENCOUNTER — Other Ambulatory Visit (HOSPITAL_BASED_OUTPATIENT_CLINIC_OR_DEPARTMENT_OTHER): Payer: Self-pay

## 2022-07-12 ENCOUNTER — Other Ambulatory Visit (HOSPITAL_BASED_OUTPATIENT_CLINIC_OR_DEPARTMENT_OTHER): Payer: Self-pay

## 2022-08-04 ENCOUNTER — Other Ambulatory Visit (HOSPITAL_BASED_OUTPATIENT_CLINIC_OR_DEPARTMENT_OTHER): Payer: Self-pay

## 2022-08-07 ENCOUNTER — Other Ambulatory Visit: Payer: Self-pay

## 2022-08-16 ENCOUNTER — Ambulatory Visit
Admission: RE | Admit: 2022-08-16 | Discharge: 2022-08-16 | Disposition: A | Discharge status: Home / Self Care | Payer: 59 | Source: Ambulatory Visit

## 2022-08-16 DIAGNOSIS — Z1231 Encounter for screening mammogram for malignant neoplasm of breast: Secondary | ICD-10-CM | POA: Diagnosis not present

## 2022-08-17 ENCOUNTER — Other Ambulatory Visit (HOSPITAL_BASED_OUTPATIENT_CLINIC_OR_DEPARTMENT_OTHER): Payer: Self-pay

## 2022-08-17 ENCOUNTER — Encounter (HOSPITAL_BASED_OUTPATIENT_CLINIC_OR_DEPARTMENT_OTHER): Payer: Self-pay

## 2022-09-11 ENCOUNTER — Other Ambulatory Visit (HOSPITAL_BASED_OUTPATIENT_CLINIC_OR_DEPARTMENT_OTHER): Payer: Self-pay

## 2022-09-19 ENCOUNTER — Other Ambulatory Visit (HOSPITAL_BASED_OUTPATIENT_CLINIC_OR_DEPARTMENT_OTHER): Payer: Self-pay

## 2022-10-02 ENCOUNTER — Telehealth (INDEPENDENT_AMBULATORY_CARE_PROVIDER_SITE_OTHER): Payer: Self-pay

## 2022-10-02 NOTE — Telephone Encounter (Signed)
See my chart message

## 2022-12-07 ENCOUNTER — Encounter (INDEPENDENT_AMBULATORY_CARE_PROVIDER_SITE_OTHER): Payer: Self-pay

## 2022-12-08 IMAGING — MG MM DIGITAL SCREENING BILAT W/ TOMO AND CAD
8 series · 8 of 24 positions shown · non-contrast
Comparison: Previous exam(s).

CLINICAL DATA: Screening.

EXAM:
DIGITAL SCREENING BILATERAL MAMMOGRAM WITH TOMOSYNTHESIS AND CAD
TECHNIQUE: Bilateral screening digital craniocaudal and mediolateral oblique
mammograms were obtained. Bilateral screening digital breast
tomosynthesis was performed. The images were evaluated with
computer-aided detection.

[L MLO synth-2D]
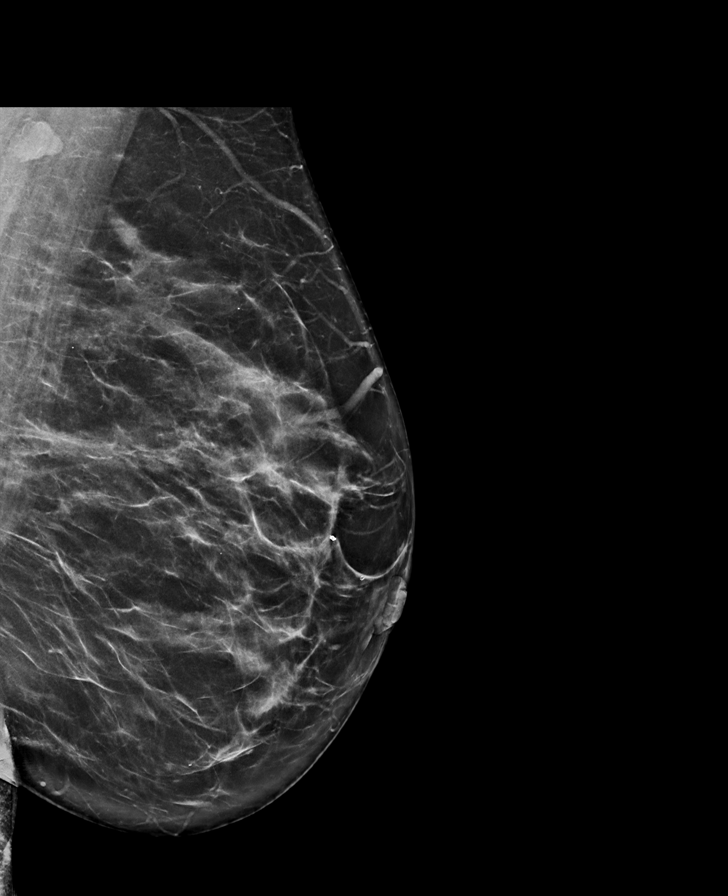

[R MLO synth-2D]
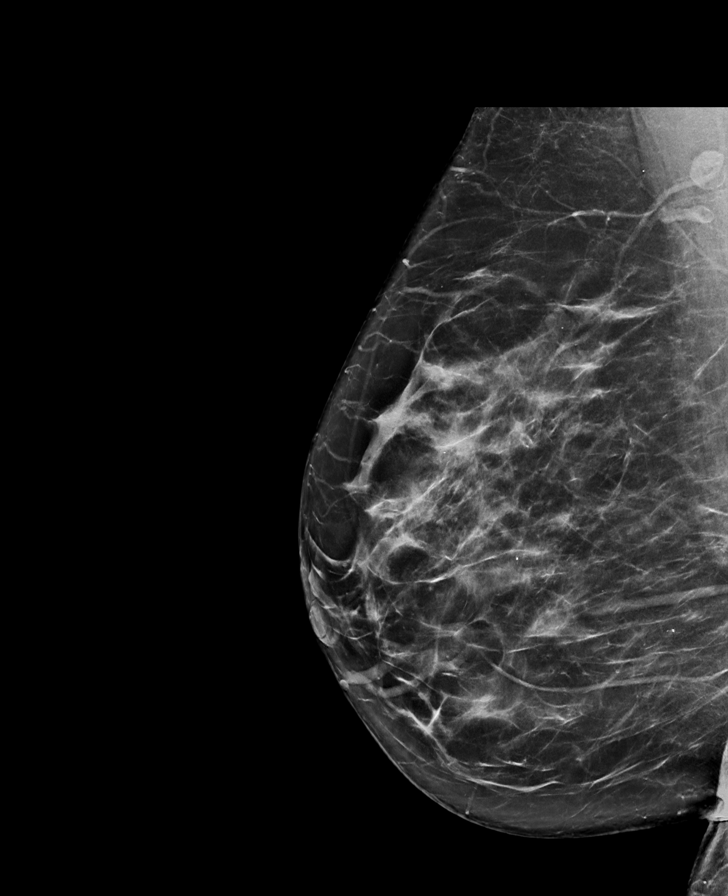

[L CC synth-2D]
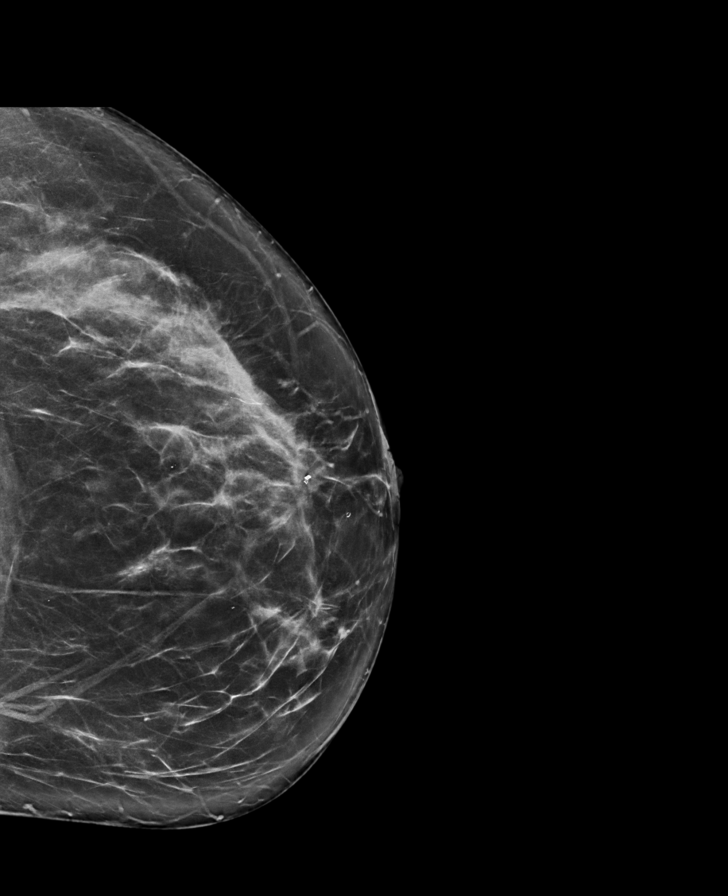

[R CC synth-2D]
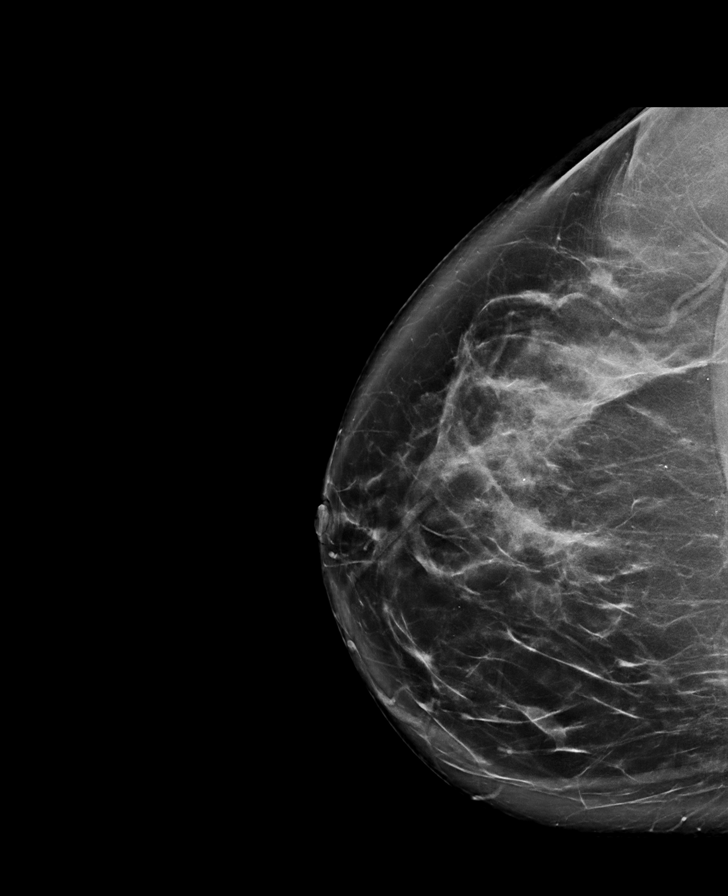

[R CC tomo · tomo slice 47/93.0]
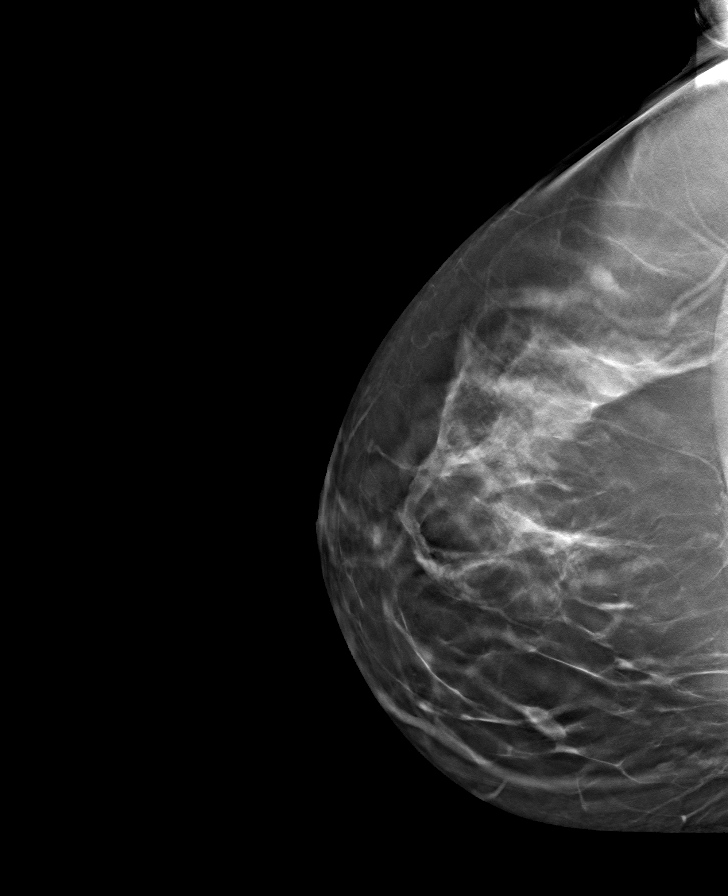

[R MLO tomo · tomo slice 42/83.0]
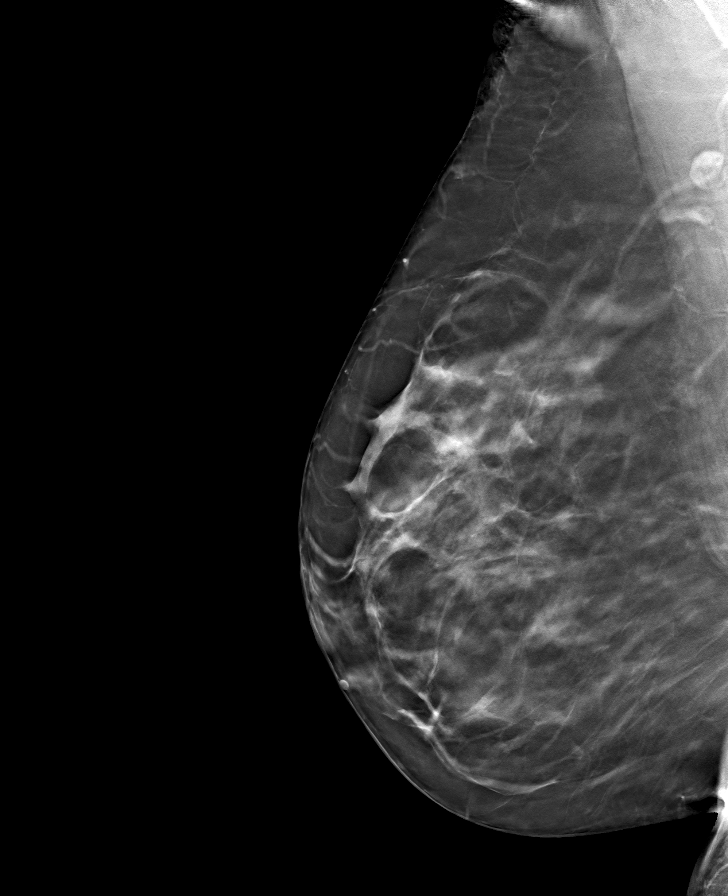

[L CC tomo · tomo slice 42/83.0]
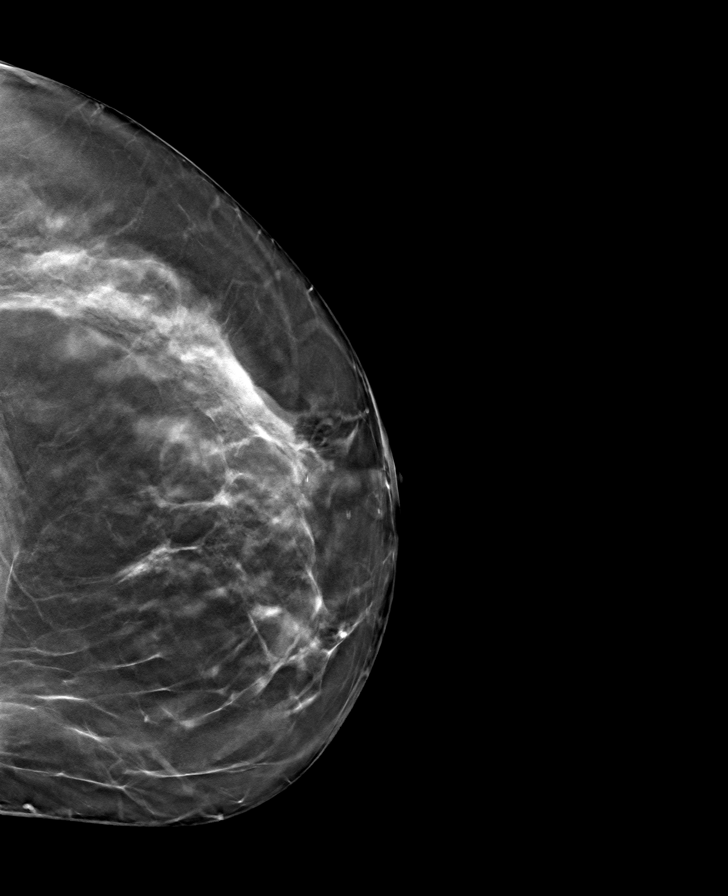

[L MLO tomo · tomo slice 41/82.0]
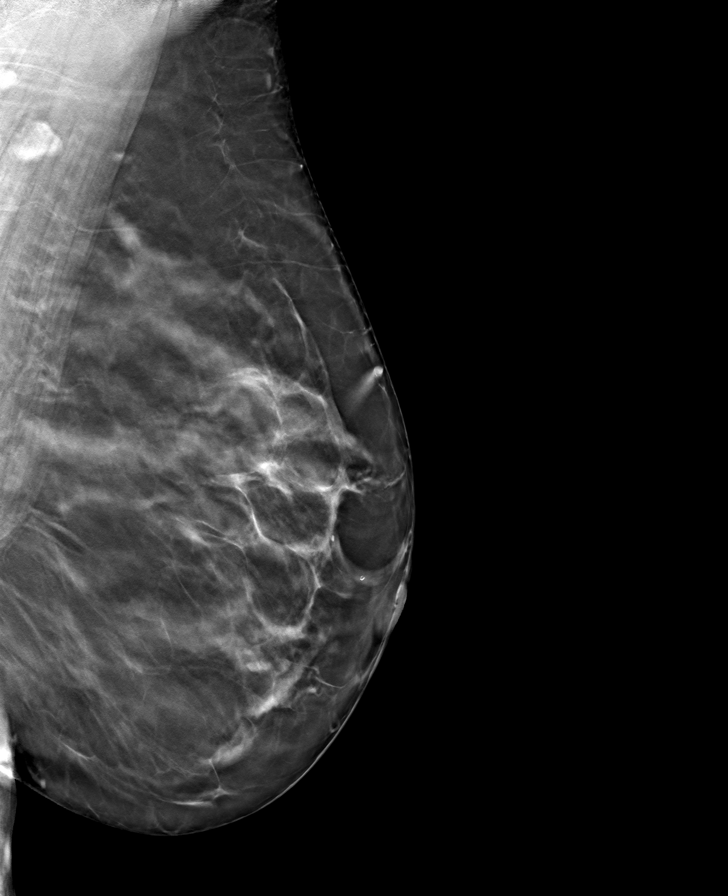

[8 of 24 positions shown; findings below may reference images not displayed]

ACR Breast Density Category b: There are scattered areas of
fibroglandular density.
FINDINGS: There are no findings suspicious for malignancy. The images were
evaluated with computer-aided detection.
IMPRESSION: No mammographic evidence of malignancy. A result letter of this
screening mammogram will be mailed directly to the patient.

RECOMMENDATION:
Screening mammogram in one year. (Code:[id number]-[id number])

BI-RADS CATEGORY  1: Negative.

## 2022-12-29 DIAGNOSIS — N915 Oligomenorrhea, unspecified: Secondary | ICD-10-CM | POA: Insufficient documentation

## 2022-12-29 DIAGNOSIS — A6000 Herpesviral infection of urogenital system, unspecified: Secondary | ICD-10-CM | POA: Insufficient documentation

## 2022-12-29 DIAGNOSIS — N970 Female infertility associated with anovulation: Secondary | ICD-10-CM | POA: Insufficient documentation

## 2023-01-01 ENCOUNTER — Ambulatory Visit: Payer: 59 | Admitting: Family Medicine

## 2023-05-18 ENCOUNTER — Encounter: Payer: Self-pay | Admitting: Family Medicine

## 2023-05-18 ENCOUNTER — Other Ambulatory Visit: Payer: 59

## 2023-05-18 ENCOUNTER — Ambulatory Visit (HOSPITAL_BASED_OUTPATIENT_CLINIC_OR_DEPARTMENT_OTHER)
Admission: RE | Admit: 2023-05-18 | Discharge: 2023-05-18 | Disposition: A | Discharge status: Home / Self Care | Payer: Commercial Managed Care - PPO | Source: Ambulatory Visit | Attending: Family Medicine | Admitting: Family Medicine

## 2023-05-18 ENCOUNTER — Other Ambulatory Visit (HOSPITAL_BASED_OUTPATIENT_CLINIC_OR_DEPARTMENT_OTHER): Payer: Self-pay

## 2023-05-18 ENCOUNTER — Ambulatory Visit (INDEPENDENT_AMBULATORY_CARE_PROVIDER_SITE_OTHER): Payer: Commercial Managed Care - PPO | Admitting: Family Medicine

## 2023-05-18 VITALS — BP 122/68 | HR 86 | Ht 67.0 in | Wt 194.0 lb

## 2023-05-18 DIAGNOSIS — M25552 Pain in left hip: Secondary | ICD-10-CM | POA: Diagnosis not present

## 2023-05-18 DIAGNOSIS — M1612 Unilateral primary osteoarthritis, left hip: Secondary | ICD-10-CM | POA: Diagnosis not present

## 2023-05-18 DIAGNOSIS — M47816 Spondylosis without myelopathy or radiculopathy, lumbar region: Secondary | ICD-10-CM | POA: Diagnosis not present

## 2023-05-18 LAB — COMPREHENSIVE METABOLIC PANEL
ALT: 26 U/L (ref 0–35)
AST: 19 U/L (ref 0–37)
Albumin: 4.2 g/dL (ref 3.5–5.2)
Alkaline Phosphatase: 73 U/L (ref 39–117)
BUN: 12 mg/dL (ref 6–23)
CO2: 28 meq/L (ref 19–32)
Calcium: 9.2 mg/dL (ref 8.4–10.5)
Chloride: 106 meq/L (ref 96–112)
Creatinine, Ser: 0.54 mg/dL (ref 0.40–1.20)
GFR: 104.75 mL/min (ref >60.00)
Glucose, Bld: 97 mg/dL (ref 70–99)
Potassium: 4 meq/L (ref 3.5–5.1)
Sodium: 142 meq/L (ref 135–145)
Total Bilirubin: 0.4 mg/dL (ref 0.2–1.2)
Total Protein: 6.4 g/dL (ref 6.0–8.3)

## 2023-05-18 LAB — VITAMIN D 25 HYDROXY (VIT D DEFICIENCY, FRACTURES): VITD: 23.11 ng/mL — ABNORMAL LOW (ref 30.00–100.00)

## 2023-05-18 MED ORDER — MELOXICAM 15 MG PO TABS
15.0000 mg | ORAL_TABLET | Freq: Every day | ORAL | 0 refills | Status: DC
  Filled 2023-05-18: qty 30, 30d supply, fill #0

## 2023-05-18 NOTE — Progress Notes (Signed)
Acute Office Visit  Subjective:     Patient ID: April Herring, female    DOB: Sep 12, 1970, 53 y.o.   MRN: [id number]  Chief Complaint  Patient presents with   Hip Pain     Patient is in today for left hip pain.   Discussed the use of AI scribe software for clinical note transcription with the patient, who gave verbal consent to proceed.  History of Present Illness   The patient presents with a six-month history of left hip pain. The pain is described as deep within the joint, not exacerbated by palpation, and occasionally radiates down the leg. The patient denies any known injuries or trauma to the area. The pain is particularly bothersome when getting in and out of a chair or car, and when climbing stairs, but does not significantly affect walking. The patient has tried ibuprofen with minimal relief. The pain has been progressively affecting the patient's gait and has become more constant over time.  The patient also reports a family history of arthritis. The patient denies any back pain.  In addition to the hip pain, the patient mentions a recent increase in sinus congestion, which they attribute to fluctuating weather conditions. The patient denies any other new symptoms or changes in health.           ROS All review of systems negative except what is listed in the HPI      Objective:    BP 122/68   Pulse 86   Ht 5\' 7"  (1.702 m)   Wt 194 lb (88 kg)   SpO2 99%   BMI 30.38 kg/m    Physical Exam Vitals reviewed.  Constitutional:      Appearance: Normal appearance.  Musculoskeletal:        General: Normal range of motion.     Comments: Left hip discomfort with adduction and external rotation; no pain on palpation   Skin:    General: Skin is warm and dry.  Neurological:     Mental Status: She is alert and oriented to person, place, and time.  Psychiatric:        Mood and Affect: Mood normal.        Behavior: Behavior normal.        Thought Content: Thought  content normal.        Judgment: Judgment normal.        No results found for any visits on 05/18/23.      Assessment & Plan:   Problem List Items Addressed This Visit   None Visit Diagnoses       Left hip pain    -  Primary Pain in the left hip for the past six months, affecting gait and causing difficulty with stairs and getting in and out of seats. Pain is deep in the joint and sometimes radiates down the leg. No associated back pain. Pain is exacerbated by certain movements and relieved slightly by ibuprofen. Family history of arthritis. -Order hip x-ray to assess for possible arthritis or other structural abnormalities. -Prescribe Meloxicam for pain relief and inflammation reduction. -Provide patient with hip exercises handout -Follow up after x-ray results are available to determine next steps, which may include physical therapy or orthopedic consultation.   Relevant Medications   meloxicam (MOBIC) 15 MG tablet   Other Relevant Orders   DG HIP UNILAT W OR W/O PELVIS 2-3 VIEWS LEFT     Hypocalcemia     - Patient is curious about her vitamin  D levels and has had low calcium in the past. We will check labs today.    Relevant Orders   Vitamin D (25 hydroxy)   Comprehensive metabolic panel          Meds ordered this encounter  Medications   meloxicam (MOBIC) 15 MG tablet    Sig: Take 1 tablet (15 mg total) by mouth daily.    Dispense:  30 tablet    Refill:  0    Supervising Provider:   Danise Edge A [[id number]]    Return if symptoms worsen or fail to improve, for ; due to schedule physical.  Clayborne Dana, NP

## 2023-05-20 ENCOUNTER — Encounter: Payer: Self-pay | Admitting: Family Medicine

## 2023-05-21 ENCOUNTER — Other Ambulatory Visit (HOSPITAL_BASED_OUTPATIENT_CLINIC_OR_DEPARTMENT_OTHER): Payer: Self-pay

## 2023-05-21 DIAGNOSIS — E559 Vitamin D deficiency, unspecified: Secondary | ICD-10-CM | POA: Diagnosis not present

## 2023-05-21 DIAGNOSIS — Z01419 Encounter for gynecological examination (general) (routine) without abnormal findings: Secondary | ICD-10-CM | POA: Diagnosis not present

## 2023-05-21 DIAGNOSIS — A6009 Herpesviral infection of other urogenital tract: Secondary | ICD-10-CM | POA: Diagnosis not present

## 2023-05-21 DIAGNOSIS — Z1389 Encounter for screening for other disorder: Secondary | ICD-10-CM | POA: Diagnosis not present

## 2023-05-21 DIAGNOSIS — Z1211 Encounter for screening for malignant neoplasm of colon: Secondary | ICD-10-CM | POA: Diagnosis not present

## 2023-05-21 MED ORDER — VALACYCLOVIR HCL 1 G PO TABS
1000.0000 mg | ORAL_TABLET | Freq: Two times a day (BID) | ORAL | 0 refills | Status: AC | PRN
  Filled 2023-05-21: qty 20, 10d supply, fill #0

## 2023-05-21 MED ORDER — OPTIMAL D3 1.25 MG (50000 UT) PO CAPS
50000.0000 [IU] | ORAL_CAPSULE | ORAL | 1 refills | Status: AC
  Filled 2023-05-21: qty 12, 84d supply, fill #0
  Filled 2023-09-10: qty 12, 84d supply, fill #1

## 2023-05-27 ENCOUNTER — Encounter: Payer: Self-pay | Admitting: Family Medicine

## 2023-06-06 DIAGNOSIS — Z1211 Encounter for screening for malignant neoplasm of colon: Secondary | ICD-10-CM | POA: Diagnosis not present

## 2023-06-12 LAB — COLOGUARD: COLOGUARD: NEGATIVE

## 2023-06-12 LAB — EXTERNAL GENERIC LAB PROCEDURE: COLOGUARD: NEGATIVE

## 2023-06-20 ENCOUNTER — Other Ambulatory Visit (HOSPITAL_BASED_OUTPATIENT_CLINIC_OR_DEPARTMENT_OTHER): Payer: Self-pay

## 2023-06-20 ENCOUNTER — Other Ambulatory Visit: Payer: Self-pay | Admitting: Family Medicine

## 2023-06-20 DIAGNOSIS — I10 Essential (primary) hypertension: Secondary | ICD-10-CM

## 2023-06-20 DIAGNOSIS — M25552 Pain in left hip: Secondary | ICD-10-CM

## 2023-06-20 MED ORDER — MELOXICAM 15 MG PO TABS
15.0000 mg | ORAL_TABLET | Freq: Every day | ORAL | 0 refills | Status: AC
  Filled 2023-06-20: qty 30, 30d supply, fill #0

## 2023-06-21 ENCOUNTER — Other Ambulatory Visit (HOSPITAL_BASED_OUTPATIENT_CLINIC_OR_DEPARTMENT_OTHER): Payer: Self-pay

## 2023-08-23 DIAGNOSIS — Z1231 Encounter for screening mammogram for malignant neoplasm of breast: Secondary | ICD-10-CM | POA: Diagnosis not present

## 2023-09-10 ENCOUNTER — Other Ambulatory Visit (HOSPITAL_BASED_OUTPATIENT_CLINIC_OR_DEPARTMENT_OTHER): Payer: Self-pay

## 2023-11-19 DIAGNOSIS — M50322 Other cervical disc degeneration at C5-C6 level: Secondary | ICD-10-CM | POA: Diagnosis not present

## 2023-11-19 DIAGNOSIS — M9901 Segmental and somatic dysfunction of cervical region: Secondary | ICD-10-CM | POA: Diagnosis not present

## 2023-11-19 DIAGNOSIS — M5417 Radiculopathy, lumbosacral region: Secondary | ICD-10-CM | POA: Diagnosis not present

## 2023-11-19 DIAGNOSIS — M5137 Other intervertebral disc degeneration, lumbosacral region with discogenic back pain only: Secondary | ICD-10-CM | POA: Diagnosis not present

## 2023-11-19 DIAGNOSIS — M50323 Other cervical disc degeneration at C6-C7 level: Secondary | ICD-10-CM | POA: Diagnosis not present

## 2023-11-19 DIAGNOSIS — M9903 Segmental and somatic dysfunction of lumbar region: Secondary | ICD-10-CM | POA: Diagnosis not present

## 2023-11-19 DIAGNOSIS — M5136 Other intervertebral disc degeneration, lumbar region with discogenic back pain only: Secondary | ICD-10-CM | POA: Diagnosis not present

## 2023-11-19 DIAGNOSIS — M5135 Other intervertebral disc degeneration, thoracolumbar region: Secondary | ICD-10-CM | POA: Diagnosis not present

## 2023-11-19 DIAGNOSIS — M50321 Other cervical disc degeneration at C4-C5 level: Secondary | ICD-10-CM | POA: Diagnosis not present

## 2023-11-19 DIAGNOSIS — M531 Cervicobrachial syndrome: Secondary | ICD-10-CM | POA: Diagnosis not present

## 2023-11-23 DIAGNOSIS — M50321 Other cervical disc degeneration at C4-C5 level: Secondary | ICD-10-CM | POA: Diagnosis not present

## 2023-11-23 DIAGNOSIS — M9903 Segmental and somatic dysfunction of lumbar region: Secondary | ICD-10-CM | POA: Diagnosis not present

## 2023-11-23 DIAGNOSIS — M5137 Other intervertebral disc degeneration, lumbosacral region with discogenic back pain only: Secondary | ICD-10-CM | POA: Diagnosis not present

## 2023-11-23 DIAGNOSIS — M50322 Other cervical disc degeneration at C5-C6 level: Secondary | ICD-10-CM | POA: Diagnosis not present

## 2023-11-23 DIAGNOSIS — M9901 Segmental and somatic dysfunction of cervical region: Secondary | ICD-10-CM | POA: Diagnosis not present

## 2023-11-23 DIAGNOSIS — M50323 Other cervical disc degeneration at C6-C7 level: Secondary | ICD-10-CM | POA: Diagnosis not present

## 2023-11-23 DIAGNOSIS — M5136 Other intervertebral disc degeneration, lumbar region with discogenic back pain only: Secondary | ICD-10-CM | POA: Diagnosis not present

## 2023-11-23 DIAGNOSIS — M5417 Radiculopathy, lumbosacral region: Secondary | ICD-10-CM | POA: Diagnosis not present

## 2023-11-23 DIAGNOSIS — M531 Cervicobrachial syndrome: Secondary | ICD-10-CM | POA: Diagnosis not present

## 2023-11-23 DIAGNOSIS — M5135 Other intervertebral disc degeneration, thoracolumbar region: Secondary | ICD-10-CM | POA: Diagnosis not present

## 2023-12-31 ENCOUNTER — Ambulatory Visit (INDEPENDENT_AMBULATORY_CARE_PROVIDER_SITE_OTHER)

## 2023-12-31 VITALS — BP 137/90 | HR 95 | Ht 66.0 in | Wt 195.0 lb

## 2023-12-31 DIAGNOSIS — N62 Hypertrophy of breast: Secondary | ICD-10-CM

## 2023-12-31 DIAGNOSIS — M25511 Pain in right shoulder: Secondary | ICD-10-CM

## 2023-12-31 DIAGNOSIS — M25512 Pain in left shoulder: Secondary | ICD-10-CM

## 2023-12-31 DIAGNOSIS — M546 Pain in thoracic spine: Secondary | ICD-10-CM | POA: Diagnosis not present

## 2023-12-31 DIAGNOSIS — M542 Cervicalgia: Secondary | ICD-10-CM | POA: Diagnosis not present

## 2023-12-31 DIAGNOSIS — G8929 Other chronic pain: Secondary | ICD-10-CM

## 2023-12-31 NOTE — Progress Notes (Signed)
 BREAST REDUCTION CONSULT Plastic & Reconstructive Surgery New Patient Visit  Patient: April Herring MRN: [id number] Date: 12/31/2023 Referring Provider: Almarie Birmingham NP  Chief Complaint: Symptomatic macromastia, cervicalgia  History of Present Illness:  This is a 53 y.o. woman with PMH and PSH as described below who presents in consultation for breast reduction.   The patient states she has been considering a breast reduction for years. She describes intermittent skin irritation in the breast folds and occasional rashes.   Back pain in the upper and lower back, including neck pain. She pulls or pins her bra straps to provide better lift and relief of the pressure and pain. She notices relief by holding her breast up manually.  Her shoulder straps cause grooves and pain and pressure that requires padding for relief. Pain medication is sometimes required with motrin  and tylenol .  Activities that are hindered by enlarged breasts include: exercise and running.  She has tried supportive clothing as well as fitted bras without improvement.  She currently wears a DD cup bra and would ideally like to be a C-D cup.   She has personal history of breast abnormalities and had a biopsy of her breast that was bening.  Her last mammogram was in 2025 and was normal.  She no recent weight changes. Of note, she has breastfed children and is done child bearing.   Past Medical History: Past Medical History:  Diagnosis Date   Alopecia    COVID-19    GERD (gastroesophageal reflux disease)    HSV-2 infection    Pulmonary embolus [facility])    Past Surgical History: Past Surgical History:  Procedure Laterality Date   BREAST BIOPSY Left 06/13/2013   benign   Current Medications: Current Outpatient Medications on File Prior to Visit  Medication Sig Dispense Refill   ACYCLOVIR PO Take by mouth.     aspirin EC 81 MG tablet Take 81 mg by mouth daily. Swallow whole.     Biotin 89999 MCG TABS Take 10,000 mcg  by mouth.     Cholecalciferol  (OPTIMAL D3) 1.25 MG (50000 UT) capsule Take 1 capsule (50,000 Units total) by mouth once a week. 12 capsule 1   clonazePAM  (KLONOPIN ) 0.5 MG tablet Take 1 tablet as needed by oral route for 30 days. 15 tablet 0   hydrochlorothiazide  (HYDRODIURIL ) 12.5 MG tablet Take 1 tablet (12.5 mg total) by mouth daily. 90 tablet 3   meloxicam  (MOBIC ) 15 MG tablet Take 1 tablet (15 mg total) by mouth daily. 30 tablet 0   pantoprazole  (PROTONIX ) 40 MG tablet Take 1 tablet(s) every day by oral route. 90 tablet 3   valACYclovir  (VALTREX ) 1000 MG tablet Take 1 tablet (1,000 mg total) by mouth every 12 (twelve) hours as needed. 20 tablet 0   No current facility-administered medications on file prior to visit.   Allergies: No Known Allergies  Family History:  History of breast cancer in maternal grandmother and paternal granfather. Otherwise, family history is negative for bleeding disorders, problems with anesthesia, connective tissue disorders. Had a PE after COVID vaccine in 2022.  Social History:  Social History   Socioeconomic History   Marital status: Married    Spouse name: Not on file   Number of children: Not on file   Years of education: Not on file   Highest education level: Not on file  Occupational History   Not on file  Tobacco Use   Smoking status: Never   Smokeless tobacco: Never  Vaping Use  Vaping status: Never Used  Substance and Sexual Activity   Alcohol use: Yes    Comment: social   Drug use: Never   Sexual activity: Not on file  Other Topics Concern   Not on file  Social History Narrative   Not on file   Social Drivers of Health   Financial Resource Strain: Not on file  Food Insecurity: Not on file  Transportation Needs: Not on file  Physical Activity: Not on file  Stress: Not on file  Social Connections: Not on file   Smoker: Denies Recreational drug use: Denies  Review of systems: 10 point review of systems performed and  negative except as noted in the HPI  Physical Exam: BP (!) 137/90 (BP Location: Left Arm, Patient Position: Sitting, Cuff Size: Large)   Pulse 95   Ht 5' 6 (1.676 m)   Wt 195 lb (88.5 kg)   SpO2 95%   BMI 31.47 kg/m  Body mass index is 31.47 kg/m.  Physical Exam           General: Well appearing, no apparent distress. Chest: Chest wall without abnormality or obvious deformity. No scoliosis, pectus excavatum or pectus carinatum. Breast: Bilateral macromastia. Grade 3 ptosis bilaterally. Right breast appears larger than left by about 150 grams. Breast parenchyma is fibrous. There are no palpable breast masses in either breast. Bilateral NAC viable and sensate. Papules everted without discharge. NACs positioned along breast meridian bilaterally. Skin quality is poor. There are no skin lesions, scars or dimpling.  - Breast measurements:  Measurements  Right Breast (cm)  Left Breast (cm)  SN-Nipple 31 30  Base Width 15.5 16  Nipple - IMF 12 14  NAC diameter 5*5 4*5  Internipple Distance 30  Neuro: Moving all four extremities spontaneously.  Psych: Appropriate mood and affect.   Pertinent Labs: @LASTLABBYCOMP (HGBA1C,TSH)@  @LASTLABBYCOMP (WBC,RBC,HGB,HCT,MCV,MCH,MCHC,RDWSD,PLT,MPV)@  @LASTLABBYCOMP (NA,K,CL,CARDIOXIDE,ANIONGAP,GLUCOSE,BUN,CREATBLD,CALCIUM,BILITOTAL,ALBUMIN,PROTEINTOT,ALKPHOS,AST,ALT)@  @LASTLABBYCOMP (VITAMINARETI,VITAMINB1WHO,VITB2,PLASMAVITAMI,VITAMINB12LE,D25OHT,FERRITIN,FOLATELEVEL,PTH,TIBC,MAGNESIUM3,PHOSPHORSP,COPPER,ZINCLEVEL,PREALBUMIN)@  Pertinent Imaging:  Assessment: In summary, this is a pleasant 53 y.o. year-old woman presenting for consultation for bilateral breast reduction in setting of symptomatic macromastia.   @APSEC @   After evaluation today, I believe the patient would be an appropriate candidate for the operation. Based on her Schnur scale, she would require a 985 g reduction per side based on insurance. I think that based on the patient  desire of being a C or D a 300-400 g resection would be safe through a reduction mammoplasty/mastopexy. We discussed the operation in detail including the potential incision patterns (e.g., vertical only versus wise pattern), and possible need for surgical drains. Based on her clinical exam, she will likely require a wise pattern with inferior pedicle.   We discussed the risks of this procedure which include but are not limited to: bleeding, infection, seroma, delayed wound healing, wound dehiscence, asymmetries, fat necrosis, hypertrophic and keloid scarring, decreased or loss of nipple sensation, partial or full loss of the NAC, numbness, paresthesias, injuries to arteries/nerves/veins, need for revision procedures, further out-of-pocket expenses for ongoing medical care, and potential need for repeat reduction in the future. We discussed that pregnancy can alter the size and shape of her breasts and revision procedures may be required after pregnancy. We discussed that while she has the same potential to breast feed as a woman who has never had a breast reduction, she may have less milk production and may require formula supplementation. We further discussed the risk of DVT/PE, fat embolism, heart attack, stroke, death as well as the risks of anesthesia. We reviewed the expected recovery period with  no heavy activities or lifting >5lbs for 6 weeks postoperatively.   She additionally has a number of medical conditions that will require clearance (e.g., PCP) in order to optimize her safety perioperatively. The patient voiced understanding throughout our discussion today. All questions and concerns were addressed to the patient's apparent satisfaction.   Plan: - Plan for bilateral reduction mammaplasty with liposuction vs. Mastopexy with liposuction of trunk - 4 hours under general anesthesia. - 6 sessions of PT and follow up with me to decide best operation for patient.  The sensitive parts of the  examination/procedure were performed with RN as chaperone.  The time documented represents the total time spent on the day of the encounter in preparing for and completing the visit. It does not include time spent by ancillary staff, a resident, a fellow, another trainee, or, for shared visits, time spent jointly with the patient or discussing the case or the performance of other separately performed services.  Time spent: 45 minutes.     Demia Viera, MD [facility]  12/31/2023 2:52 PM

## 2024-01-21 DIAGNOSIS — D485 Neoplasm of uncertain behavior of skin: Secondary | ICD-10-CM | POA: Diagnosis not present

## 2024-01-21 DIAGNOSIS — D2261 Melanocytic nevi of right upper limb, including shoulder: Secondary | ICD-10-CM | POA: Diagnosis not present

## 2024-01-21 DIAGNOSIS — D23121 Other benign neoplasm of skin of left upper eyelid, including canthus: Secondary | ICD-10-CM | POA: Diagnosis not present

## 2024-01-21 DIAGNOSIS — B351 Tinea unguium: Secondary | ICD-10-CM | POA: Diagnosis not present

## 2024-01-21 DIAGNOSIS — D1801 Hemangioma of skin and subcutaneous tissue: Secondary | ICD-10-CM | POA: Diagnosis not present

## 2024-01-21 DIAGNOSIS — D225 Melanocytic nevi of trunk: Secondary | ICD-10-CM | POA: Diagnosis not present

## 2024-01-21 DIAGNOSIS — L814 Other melanin hyperpigmentation: Secondary | ICD-10-CM | POA: Diagnosis not present

## 2024-01-21 DIAGNOSIS — D224 Melanocytic nevi of scalp and neck: Secondary | ICD-10-CM | POA: Diagnosis not present

## 2024-01-21 DIAGNOSIS — D2262 Melanocytic nevi of left upper limb, including shoulder: Secondary | ICD-10-CM | POA: Diagnosis not present

## 2024-01-21 DIAGNOSIS — L608 Other nail disorders: Secondary | ICD-10-CM | POA: Diagnosis not present

## 2024-02-25 ENCOUNTER — Ambulatory Visit

## 2024-05-26 ENCOUNTER — Telehealth: Payer: Self-pay | Admitting: Family Medicine

## 2024-05-26 NOTE — Telephone Encounter (Signed)
 Anyone particular or do you want me to send her list?

## 2024-05-26 NOTE — Telephone Encounter (Signed)
 Copied from CRM [id number]. Topic: Referral - Question >> May 26, 2024 12:37 PM Viola F wrote: Reason for CRM: Patient requesting recommendation from [place] Mon regarding a marriage counselor in the area. I let her know that she can call her insurance company in the meantime to see which counselors are covered in her area. She sill would like a suggestion from [place] if possible.

## 2024-05-27 NOTE — Telephone Encounter (Signed)
 Called patient to make her aware mychart message sent with information. To call with any questions.
